# Patient Record
Sex: Female | Born: 1940 | Race: White | Hispanic: No | Smoking: Never smoker
Health system: Southern US, Community
[De-identification: ages and names within clinical notes are randomized; demographics above are authoritative.]

## PROBLEM LIST (undated history)

## (undated) DIAGNOSIS — N289 Disorder of kidney and ureter, unspecified: Secondary | ICD-10-CM

## (undated) DIAGNOSIS — R011 Cardiac murmur, unspecified: Secondary | ICD-10-CM

## (undated) DIAGNOSIS — E039 Hypothyroidism, unspecified: Secondary | ICD-10-CM

## (undated) DIAGNOSIS — E079 Disorder of thyroid, unspecified: Secondary | ICD-10-CM

## (undated) DIAGNOSIS — I10 Essential (primary) hypertension: Secondary | ICD-10-CM

## (undated) DIAGNOSIS — M1990 Unspecified osteoarthritis, unspecified site: Secondary | ICD-10-CM

## (undated) DIAGNOSIS — E7800 Pure hypercholesterolemia, unspecified: Secondary | ICD-10-CM

## (undated) HISTORY — PX: APPENDECTOMY: SHX54

## (undated) HISTORY — PX: OTHER SURGICAL HISTORY: SHX169

## (undated) HISTORY — DX: Essential (primary) hypertension: I10

## (undated) HISTORY — PX: BREAST SURGERY: SHX581

## (undated) HISTORY — DX: Pure hypercholesterolemia, unspecified: E78.00

## (undated) HISTORY — DX: Disorder of thyroid, unspecified: E07.9

## (undated) HISTORY — PX: ABDOMINAL HYSTERECTOMY: SHX81

## (undated) HISTORY — PX: GALLBLADDER SURGERY: SHX652

---

## 2004-07-04 ENCOUNTER — Ambulatory Visit: Payer: Self-pay | Admitting: Internal Medicine

## 2004-07-07 ENCOUNTER — Ambulatory Visit: Payer: Self-pay | Admitting: Internal Medicine

## 2004-07-27 ENCOUNTER — Ambulatory Visit: Payer: Self-pay | Admitting: Surgery

## 2004-07-27 HISTORY — PX: BREAST BIOPSY: SHX20

## 2005-02-20 ENCOUNTER — Ambulatory Visit: Payer: Self-pay | Admitting: Surgery

## 2005-03-09 ENCOUNTER — Ambulatory Visit: Payer: Self-pay | Admitting: Surgery

## 2005-05-22 ENCOUNTER — Encounter: Payer: Self-pay | Admitting: Physical Medicine and Rehabilitation

## 2005-06-08 ENCOUNTER — Encounter: Payer: Self-pay | Admitting: Physical Medicine and Rehabilitation

## 2005-10-18 ENCOUNTER — Ambulatory Visit: Payer: Self-pay | Admitting: Surgery

## 2006-06-08 ENCOUNTER — Ambulatory Visit: Payer: Self-pay | Admitting: Gastroenterology

## 2006-10-24 IMAGING — US ULTRASOUND LEFT BREAST
1 series · 8 of 8 positions shown · non-contrast
Comparison: none

REASON FOR EXAM: Density  US PRN
COMMENTS:

[Series 1: ultrasound left breast · 8 of 8 slices shown]
[im 1/8]
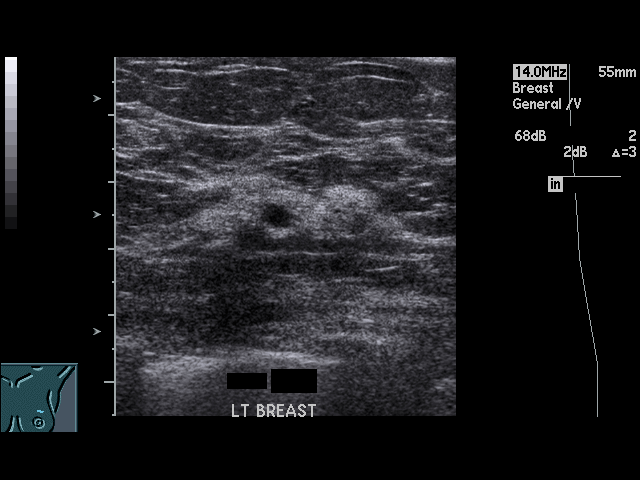
[im 2/8]
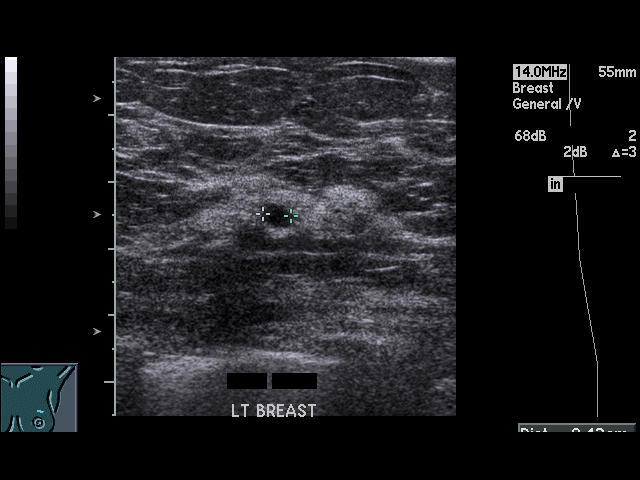
[im 3/8]
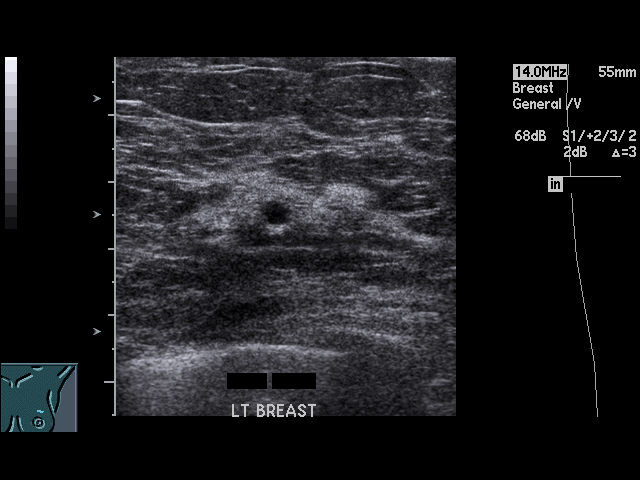
[im 4/8]
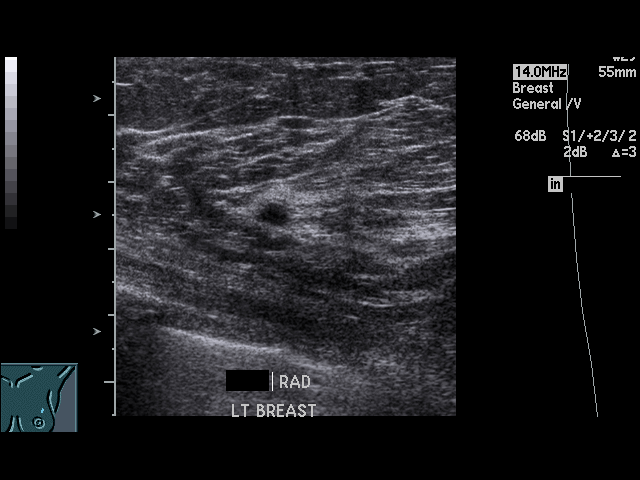
[im 5/8]
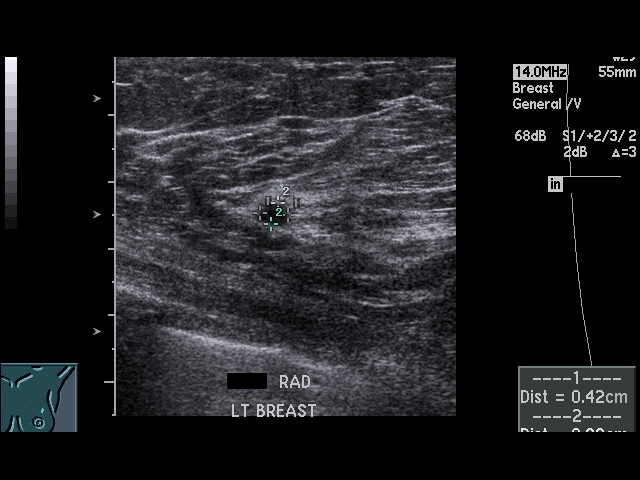
[im 6/8]
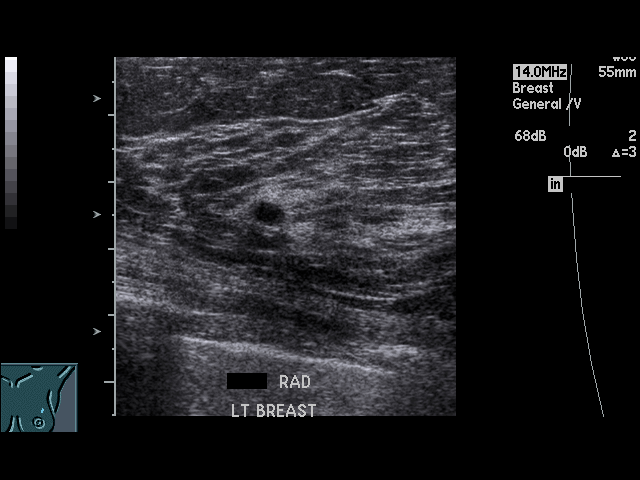
[im 7/8]
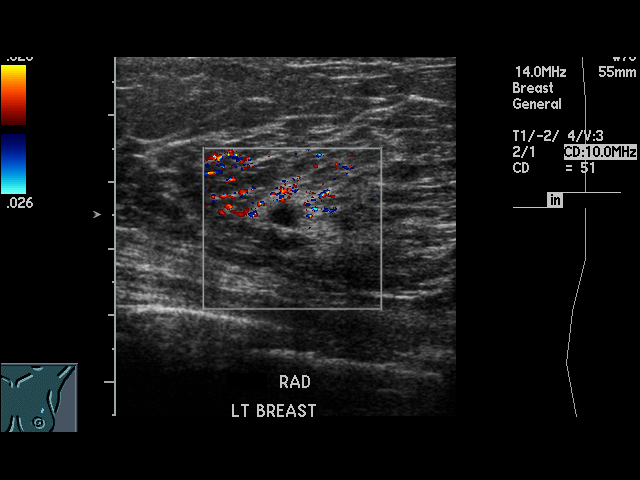
[im 8/8]
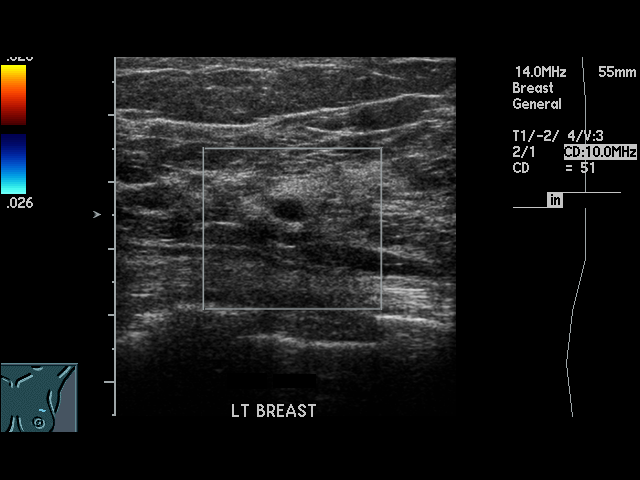

[8 of 8 positions shown; findings below may reference images not displayed]

PROCEDURE:     US  - US BREAST LEFT  - March 09, 2005 [DATE]

RESULT:     The patient returned for additional mediolateral and
magnification compression views of the deep upper outer portion of the LEFT
breast near the chest wall.  The area of ill-defined increased density
appears to persist although on the LEFT MLO projection it does not appear to
be as well circumscribed.  Given that this is the area of previous biopsy
with question as to whether this nodule is present in the specimen, surgical
consultation with an eye toward possible rebiopsy of this area is again
suggested.  The ultrasound performed today is compared to the prior study
from 07/07/04.  There is an area of hypoechogenicity in the [DATE] position of
the LEFT breast which measures 4.2 x 4.2 x 3.3 cm.  This appears to be
similar to the smaller nodularity seen on the prior study and the larger
area seen on the prior ultrasound is not definitely identified in the [DATE]
position.  There does not appear to be significant shadowing of this area of
hypoechogenicity or definite internal echo.
IMPRESSION: Persistent nodular density in the upper outer quadrant of the LEFT breast.
Sonographically this does not show definite shadowing on today's exam and
only a single area of hypoechogenicity is seen where as on the prior study
there were two areas.  Continued surgical followup is recommended.
Consideration for biopsy can be given.  The possibility of six-month
follow-up of the LEFT breast could also be considered based on the surgical
assessment and the patient's comfort level.

BI-RADS:  Category 4 - Suspicious Abnormality.

## 2006-11-22 ENCOUNTER — Ambulatory Visit: Payer: Self-pay | Admitting: Internal Medicine

## 2007-02-07 ENCOUNTER — Ambulatory Visit: Payer: Self-pay | Admitting: Internal Medicine

## 2007-04-23 ENCOUNTER — Ambulatory Visit: Payer: Self-pay | Admitting: Gastroenterology

## 2007-05-07 ENCOUNTER — Encounter: Payer: Self-pay | Admitting: Physical Medicine and Rehabilitation

## 2007-05-09 ENCOUNTER — Encounter: Payer: Self-pay | Admitting: Physical Medicine and Rehabilitation

## 2007-06-09 ENCOUNTER — Encounter: Payer: Self-pay | Admitting: Physical Medicine and Rehabilitation

## 2007-12-05 ENCOUNTER — Ambulatory Visit: Payer: Self-pay | Admitting: Internal Medicine

## 2007-12-06 ENCOUNTER — Encounter: Payer: Self-pay | Admitting: Physical Medicine and Rehabilitation

## 2007-12-07 ENCOUNTER — Encounter: Payer: Self-pay | Admitting: Physical Medicine and Rehabilitation

## 2008-05-06 ENCOUNTER — Encounter: Payer: Self-pay | Admitting: Physical Medicine and Rehabilitation

## 2008-10-29 ENCOUNTER — Ambulatory Visit: Payer: Self-pay | Admitting: Gastroenterology

## 2008-12-10 ENCOUNTER — Ambulatory Visit: Payer: Self-pay | Admitting: Internal Medicine

## 2009-02-09 ENCOUNTER — Ambulatory Visit: Payer: Self-pay | Admitting: Gastroenterology

## 2009-12-14 ENCOUNTER — Ambulatory Visit: Payer: Self-pay | Admitting: Internal Medicine

## 2010-03-09 ENCOUNTER — Ambulatory Visit: Payer: Self-pay | Admitting: Physical Medicine and Rehabilitation

## 2010-09-26 IMAGING — CT CT ASPIRATION
1 of 3 series · 15 of 32 positions shown, 20 images · non-contrast
Comparison: none

REASON FOR EXAM: elevated lft's
COMMENTS:

PROCEDURE:     CT  - CT GUIDED BIOPSY or ASPIRATION  - February 09, 2009 [DATE]
RESULT:
HISTORY: Elevated LFTs.

[Series 2: soft tissue · axial · 0.73mm/px · z∈[-617,-419]mm · 15 of 74 slices shown, 20 images]
[im 4/74  soft-tissue]
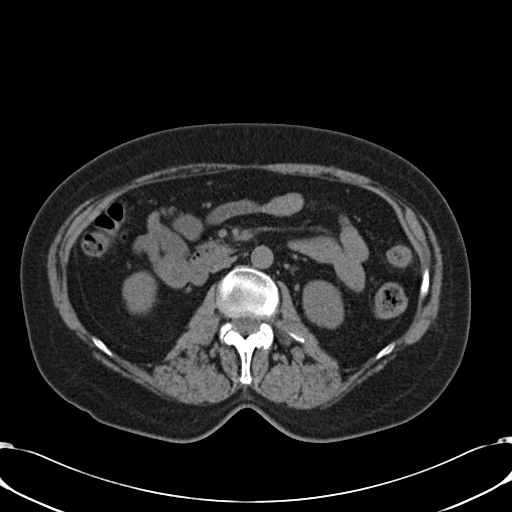
[im 4/74  bone]
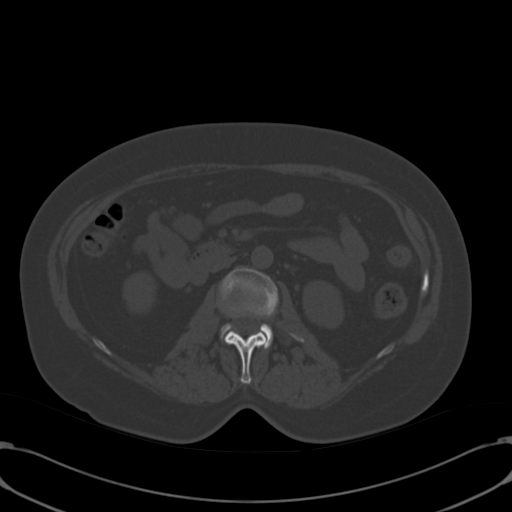
[im 11/74  soft-tissue]
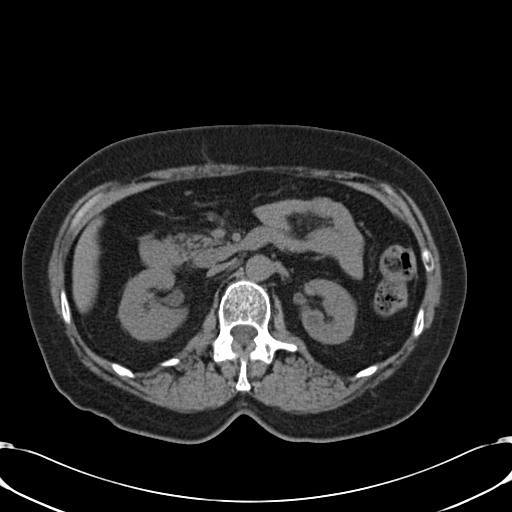
[im 15/74  soft-tissue]
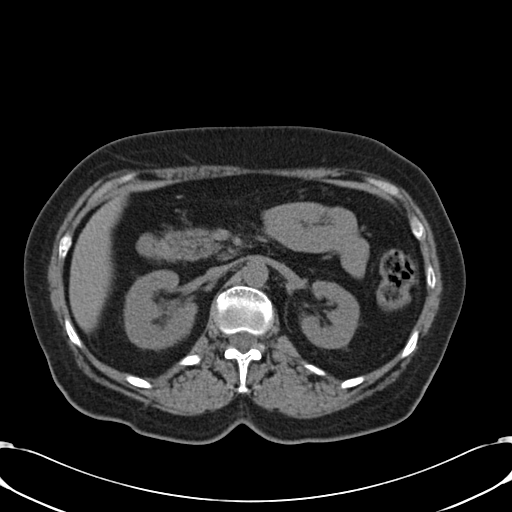
[im 19/74  soft-tissue]
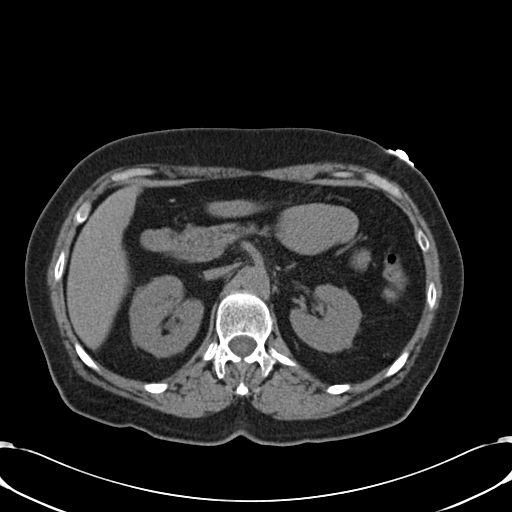
[im 26/74  soft-tissue]
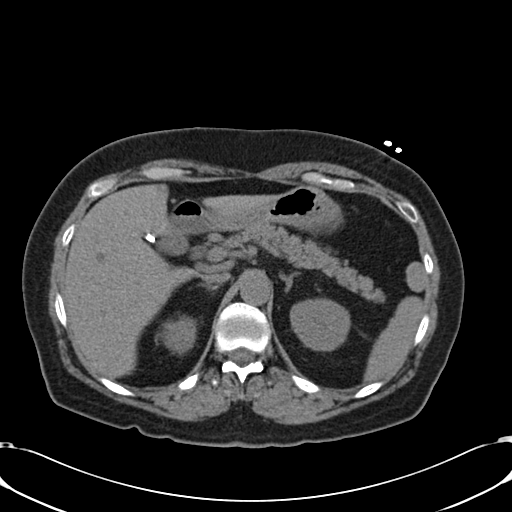
[im 30/74  soft-tissue]
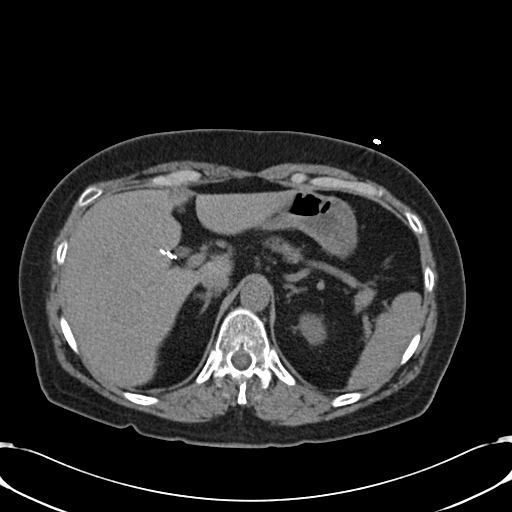
[im 33/74  soft-tissue]
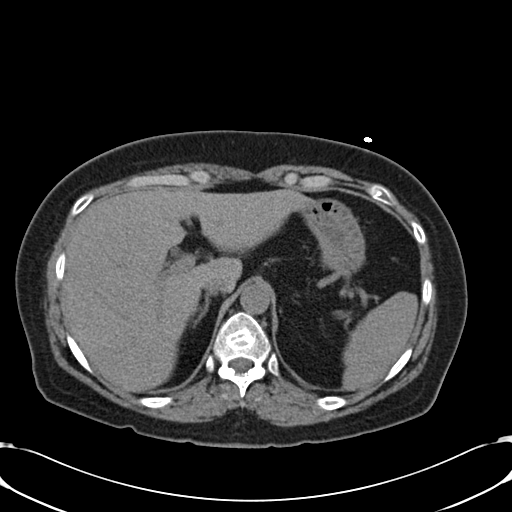
[im 41/74  soft-tissue]
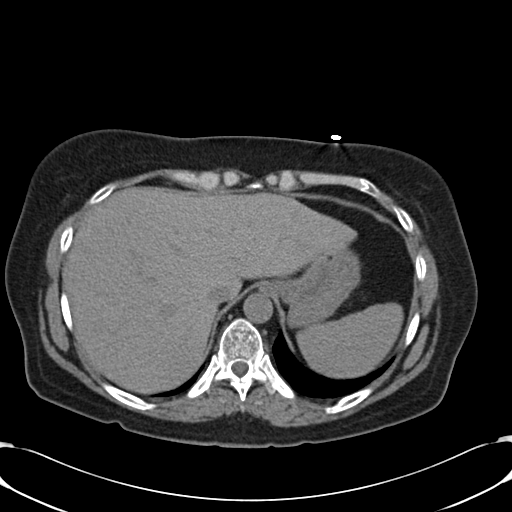
[im 44/74  soft-tissue]
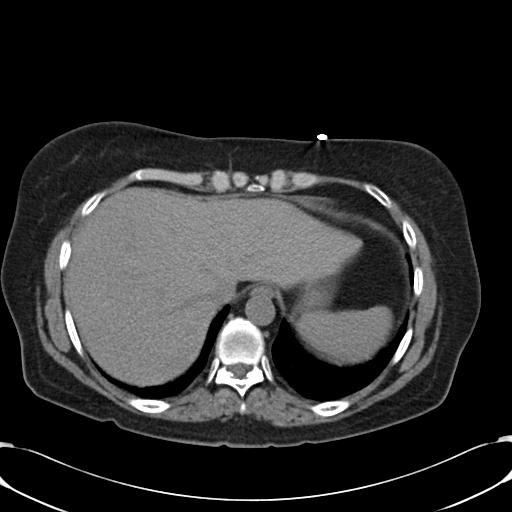
[im 44/74  bone]
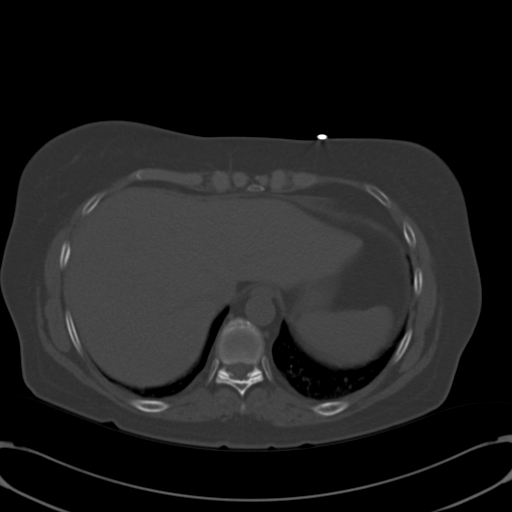
[im 48/74  soft-tissue]
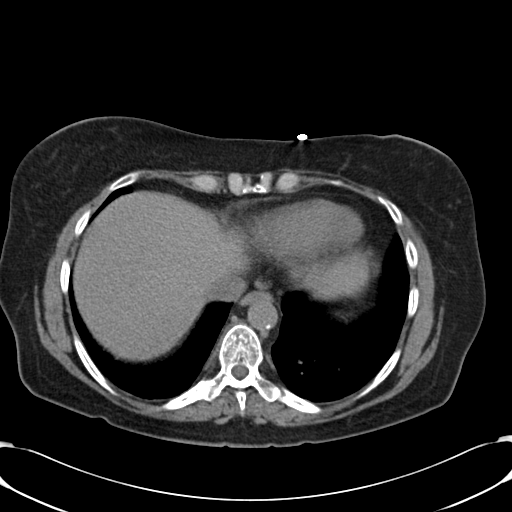
[im 55/74  soft-tissue]
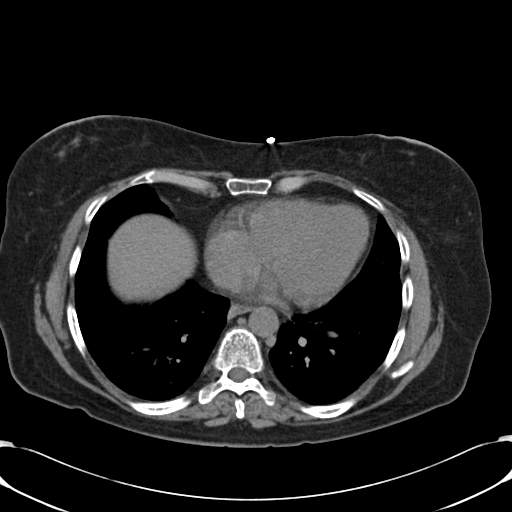
[im 59/74  soft-tissue]
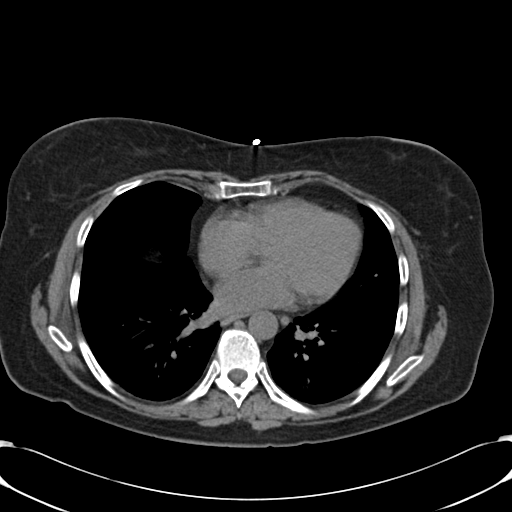
[im 59/74  lung]
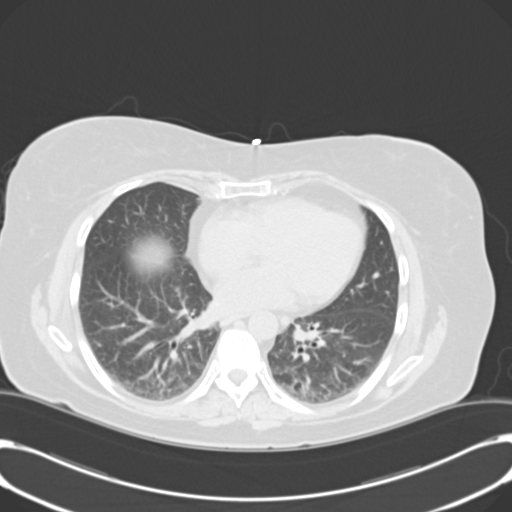
[im 63/74  soft-tissue]
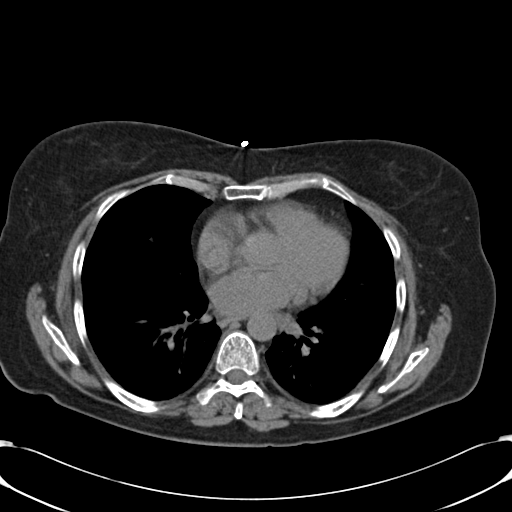
[im 63/74  lung]
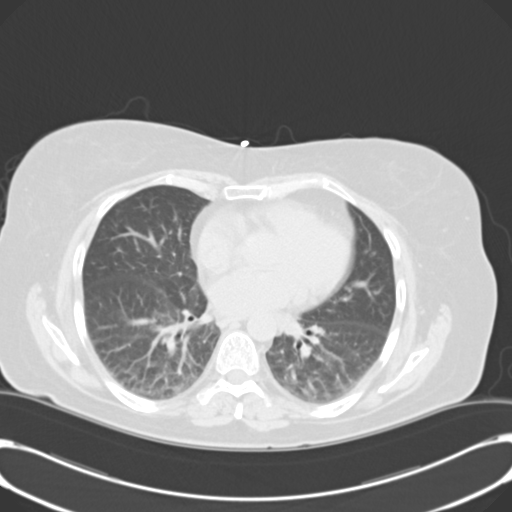
[im 66/74  lung]
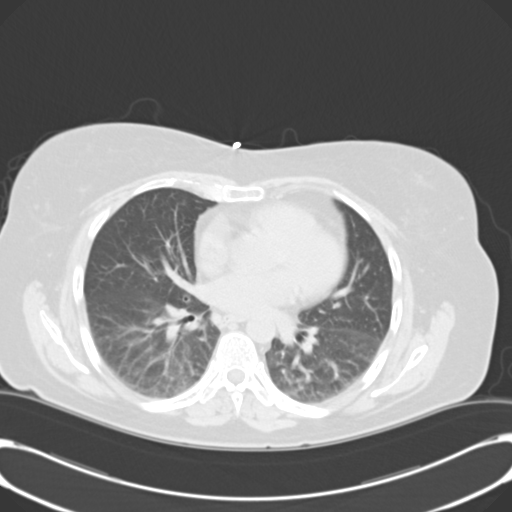
[im 70/74  soft-tissue]
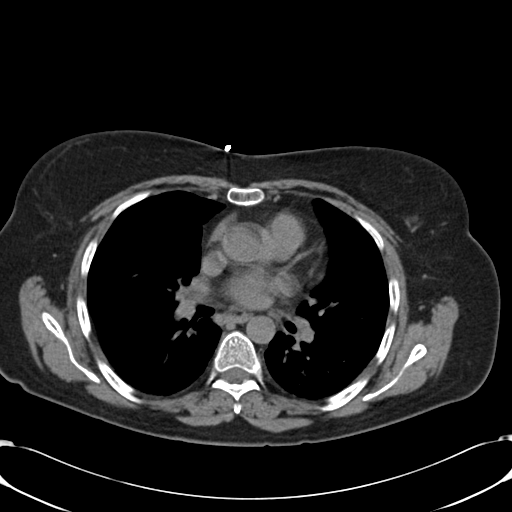
[im 70/74  lung]
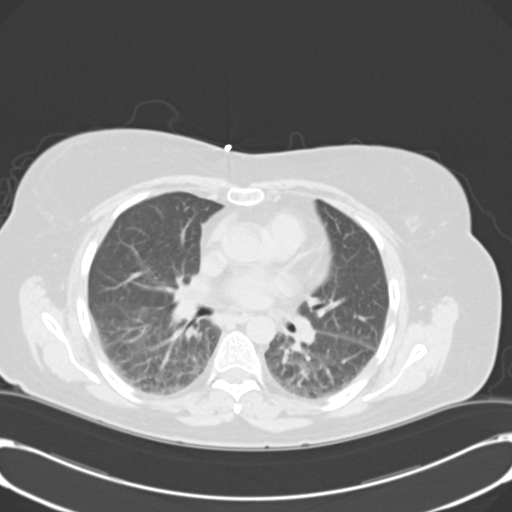

[15 of 32 positions shown; findings below may reference images not displayed]

PROCEDURE AND FINDINGS: After discussing the risks and benefits of this
procedure with the patient, informed consent was obtained. The right abdomen
was sterilely prepped and draped and following local anesthesia with 1%
lidocaine an 18-gauge needle was advanced into the liver and 18-gauge core
sample was obtained for histologic evaluation.
IMPRESSION: 1. Successful hepatic biopsy, CT directed. The exam was performed under CT
guidance as no good location for the biopsy was noted with ultrasound.

## 2010-12-28 ENCOUNTER — Ambulatory Visit: Payer: Self-pay | Admitting: Internal Medicine

## 2011-03-29 ENCOUNTER — Encounter: Payer: Self-pay | Admitting: Physical Medicine and Rehabilitation

## 2011-04-08 ENCOUNTER — Encounter: Payer: Self-pay | Admitting: Physical Medicine and Rehabilitation

## 2011-05-09 ENCOUNTER — Encounter: Payer: Self-pay | Admitting: Physical Medicine and Rehabilitation

## 2011-10-24 IMAGING — US US RENAL KIDNEY
1 series · 17 of 24 positions shown · non-contrast
Comparison: none

REASON FOR EXAM: kidney cyst
COMMENTS:

[Series 1: us renal kidney · 17 of 24 slices shown]
[im 1/24]
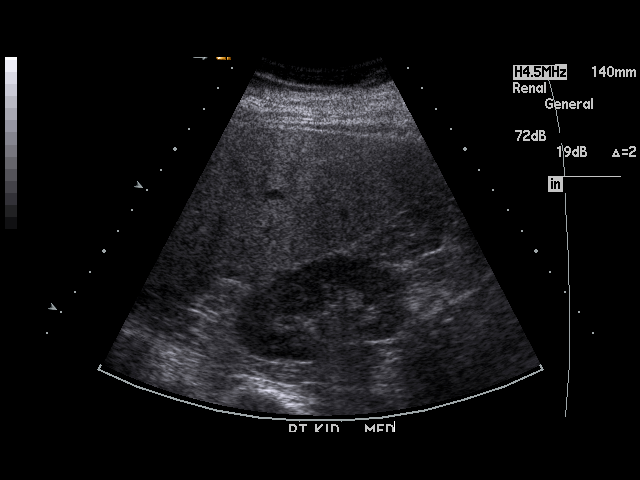
[im 3/24]
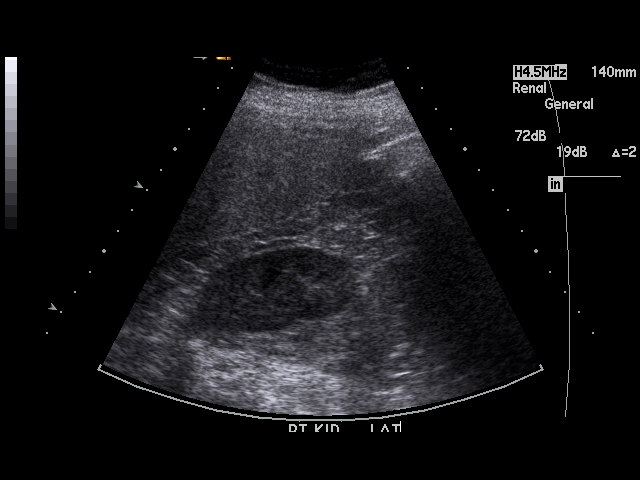
[im 4/24]
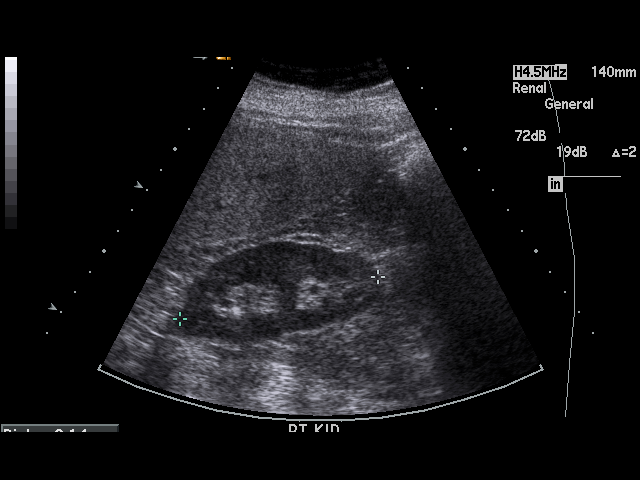
[im 5/24]
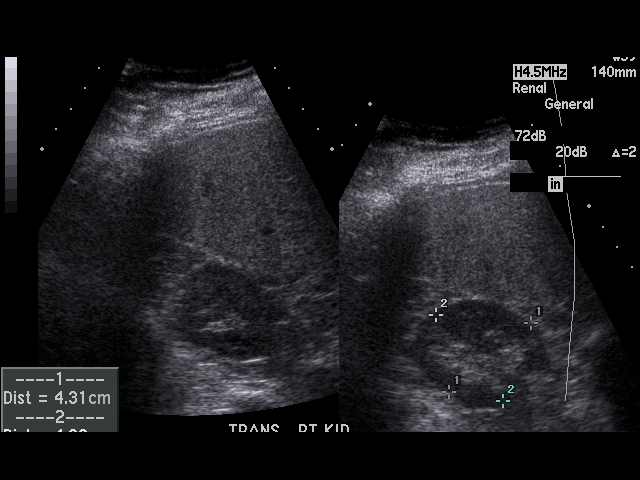
[im 7/24]
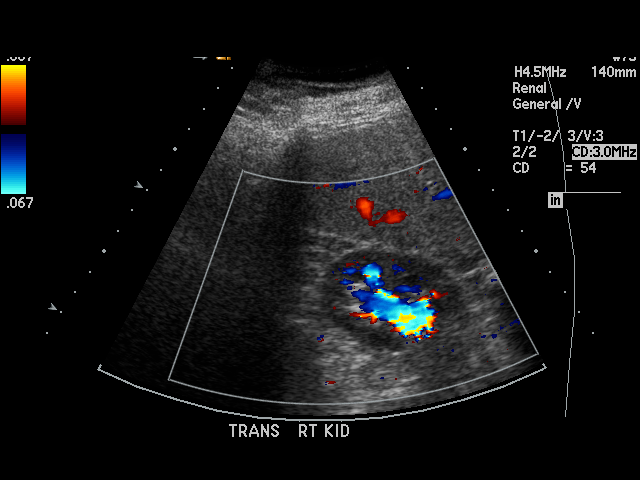
[im 8/24]
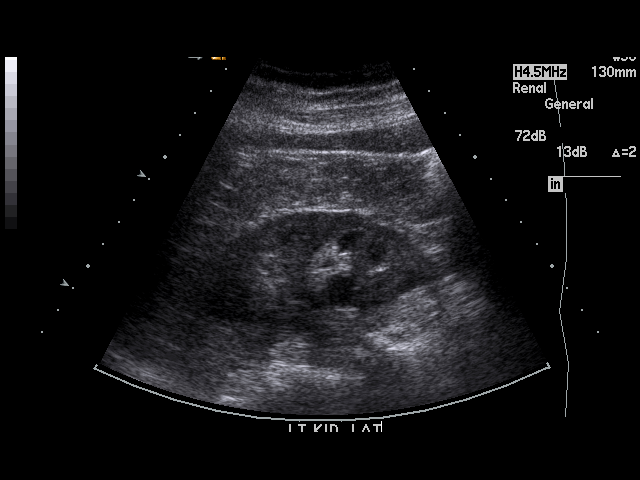
[im 10/24]
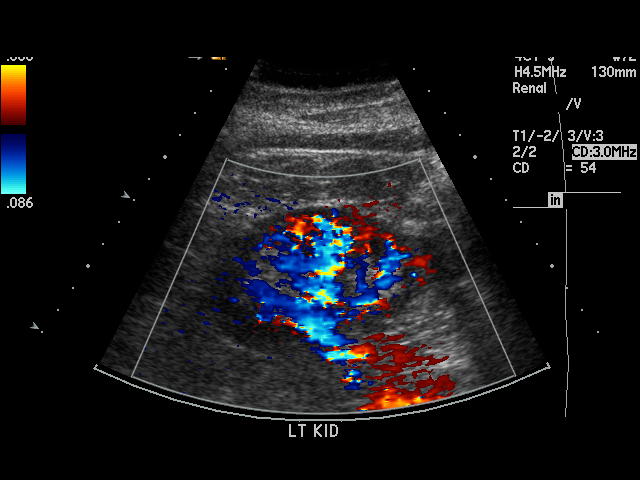
[im 11/24]
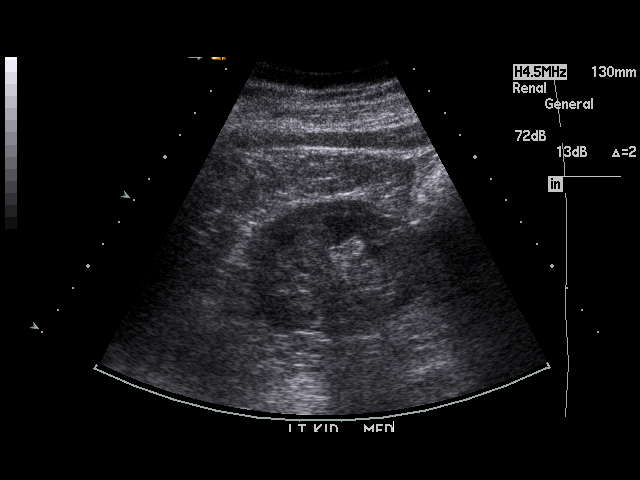
[im 13/24]
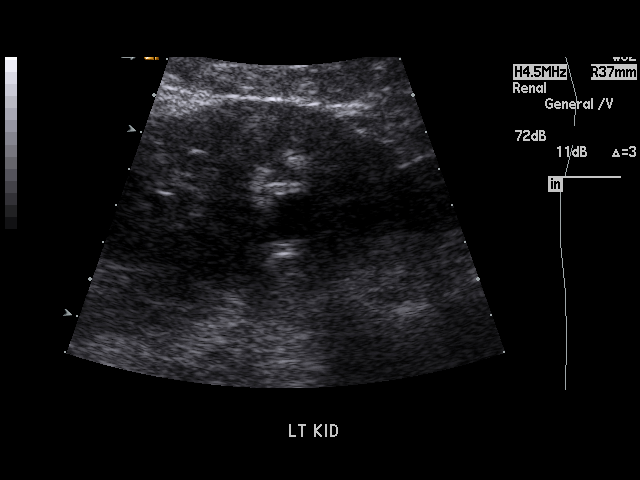
[im 14/24]
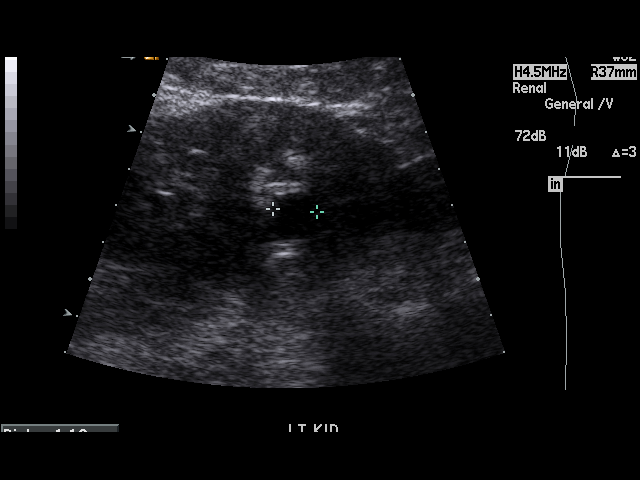
[im 15/24]
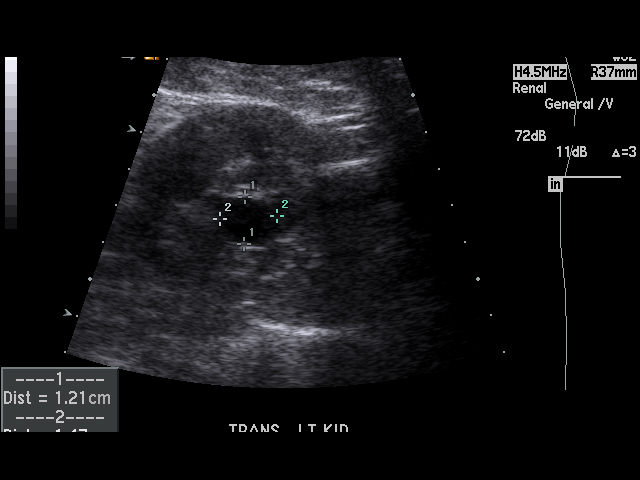
[im 17/24]
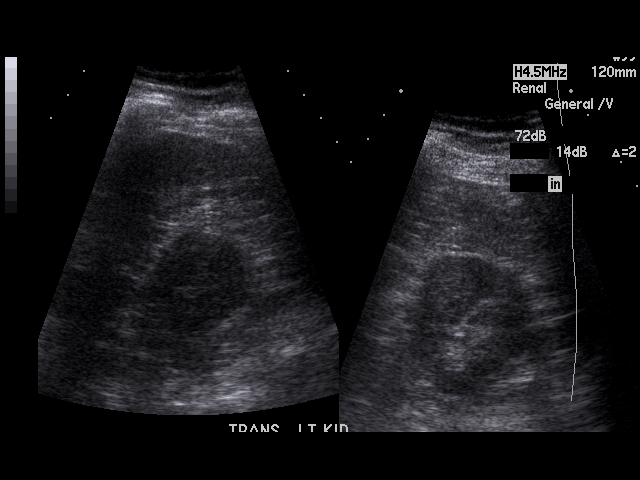
[im 18/24]
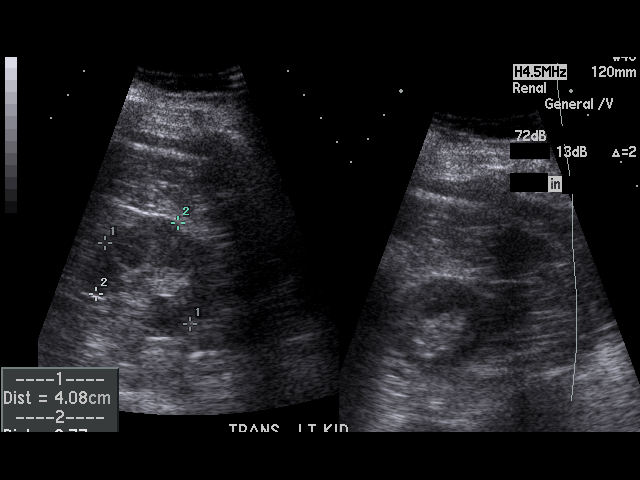
[im 20/24]
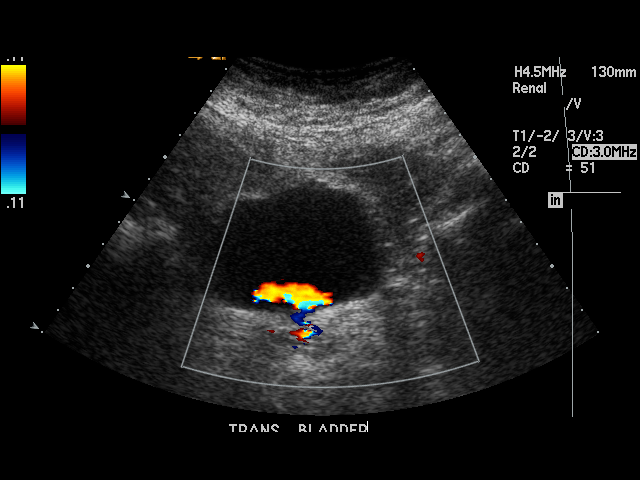
[im 21/24]
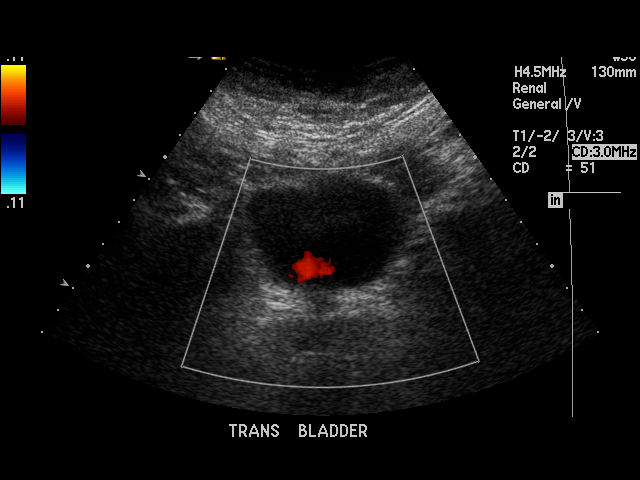
[im 22/24]
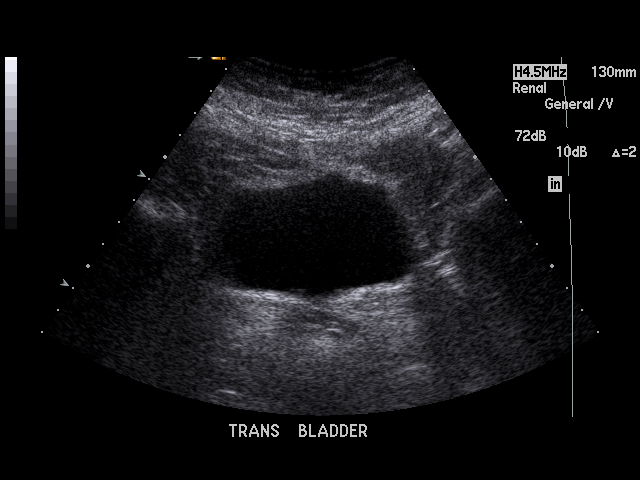
[im 24/24]
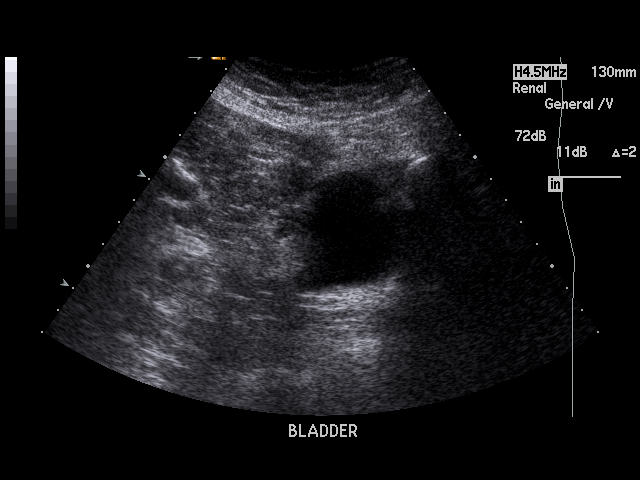

[17 of 24 positions shown; findings below may reference images not displayed]

PROCEDURE:     [facility] - AM[facility] KIDNEY  - March 09, 2010 [DATE]

RESULT:     Renal sonogram shows the right kidney measures 8.3 x 4.3 x
cm. There is no hydronephrosis or mass. No stones are evident. The left
kidney measures 9.0 x 4.1 x 3.7 cm and demonstrates no evidence of a stone
or obstruction. There is a 1.13 x 1.21 x 1.47 cm hypodense area in the mid
left kidney parapelvic region suggestive of a possible cyst. There is urine
in the bladder with a normal color Doppler evidence of a ureteral jet
demonstrated.
IMPRESSION: Single left renal cyst.

## 2012-01-02 ENCOUNTER — Ambulatory Visit: Payer: Self-pay | Admitting: Internal Medicine

## 2013-01-14 ENCOUNTER — Ambulatory Visit: Payer: Self-pay | Admitting: Internal Medicine

## 2013-12-28 DIAGNOSIS — M810 Age-related osteoporosis without current pathological fracture: Secondary | ICD-10-CM | POA: Insufficient documentation

## 2013-12-28 DIAGNOSIS — M545 Low back pain, unspecified: Secondary | ICD-10-CM | POA: Insufficient documentation

## 2013-12-28 DIAGNOSIS — N183 Chronic kidney disease, stage 3 unspecified: Secondary | ICD-10-CM | POA: Insufficient documentation

## 2013-12-28 DIAGNOSIS — I10 Essential (primary) hypertension: Secondary | ICD-10-CM | POA: Insufficient documentation

## 2013-12-28 DIAGNOSIS — G8929 Other chronic pain: Secondary | ICD-10-CM | POA: Insufficient documentation

## 2013-12-28 DIAGNOSIS — J302 Other seasonal allergic rhinitis: Secondary | ICD-10-CM | POA: Insufficient documentation

## 2014-02-10 ENCOUNTER — Ambulatory Visit: Payer: Self-pay | Admitting: Internal Medicine

## 2014-03-04 ENCOUNTER — Ambulatory Visit (INDEPENDENT_AMBULATORY_CARE_PROVIDER_SITE_OTHER): Payer: Medicare PPO

## 2014-03-04 ENCOUNTER — Encounter: Payer: Self-pay | Admitting: *Deleted

## 2014-03-04 ENCOUNTER — Other Ambulatory Visit: Payer: Self-pay | Admitting: *Deleted

## 2014-03-04 ENCOUNTER — Ambulatory Visit (INDEPENDENT_AMBULATORY_CARE_PROVIDER_SITE_OTHER): Payer: Medicare PPO | Admitting: Podiatry

## 2014-03-04 ENCOUNTER — Encounter: Payer: Self-pay | Admitting: Podiatry

## 2014-03-04 VITALS — BP 139/69 | HR 78 | Resp 16 | Ht 65.0 in | Wt 165.0 lb

## 2014-03-04 DIAGNOSIS — G4700 Insomnia, unspecified: Secondary | ICD-10-CM | POA: Insufficient documentation

## 2014-03-04 DIAGNOSIS — M7752 Other enthesopathy of left foot: Secondary | ICD-10-CM

## 2014-03-04 DIAGNOSIS — Z780 Asymptomatic menopausal state: Secondary | ICD-10-CM | POA: Insufficient documentation

## 2014-03-04 DIAGNOSIS — E538 Deficiency of other specified B group vitamins: Secondary | ICD-10-CM | POA: Insufficient documentation

## 2014-03-04 DIAGNOSIS — R609 Edema, unspecified: Secondary | ICD-10-CM

## 2014-03-04 DIAGNOSIS — M779 Enthesopathy, unspecified: Secondary | ICD-10-CM

## 2014-03-04 DIAGNOSIS — M778 Other enthesopathies, not elsewhere classified: Secondary | ICD-10-CM

## 2014-03-04 DIAGNOSIS — E559 Vitamin D deficiency, unspecified: Secondary | ICD-10-CM | POA: Insufficient documentation

## 2014-03-04 DIAGNOSIS — K219 Gastro-esophageal reflux disease without esophagitis: Secondary | ICD-10-CM | POA: Insufficient documentation

## 2014-03-04 DIAGNOSIS — E039 Hypothyroidism, unspecified: Secondary | ICD-10-CM | POA: Insufficient documentation

## 2014-03-04 DIAGNOSIS — M2042 Other hammer toe(s) (acquired), left foot: Secondary | ICD-10-CM

## 2014-03-04 DIAGNOSIS — E785 Hyperlipidemia, unspecified: Secondary | ICD-10-CM | POA: Insufficient documentation

## 2014-03-04 NOTE — Progress Notes (Signed)
   Subjective:    Patient ID: Peggy Phillips, female    DOB: Jul 31, 1940, 73 y.o.   MRN: [id number]  HPI Comments: Second toe left foot seems to be swollen and hurts to touch. It started last winter and seems to get worse   Toe Pain       Review of Systems  Musculoskeletal: Positive for back pain.       Objective:   Physical Exam: I have reviewed her past medical history medications allergies surgery social history and review of systems. Pulses are strongly palpable bilateral. Neurologic sensorium is intact per Semmes-Weinstein monofilament. Deep tendon reflexes intact bilateral muscle strength is 5 over 5 dorsiflexion plantar flexors and inverters everters all of his musculature is intact. Orthopedic evaluation demonstrates hammertoe deformity second left with erythema and edema to the PIPJ and DIPJ of the second digit left foot. Radiographic evaluation of the second digit left foot. 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        Assessment & Plan:  Assessment: Capsulitis hammertoe deformity second digit left foot.  Plan: Discussed etiology pathology conservative versus surgical therapies. I injected the PIPJ of the DIPJ with dexamethasone and local anesthetic. We discussed surgical correction consisting of any DIPJ and PIPJ fusion with screw second digit left foot. Should this injection therapy not alleviate her symptoms and she is considering surgical intervention.

## 2015-01-18 ENCOUNTER — Ambulatory Visit: Payer: Medicare PPO | Admitting: Podiatry

## 2016-01-17 ENCOUNTER — Other Ambulatory Visit: Payer: Self-pay | Admitting: Internal Medicine

## 2016-01-17 DIAGNOSIS — Z1231 Encounter for screening mammogram for malignant neoplasm of breast: Secondary | ICD-10-CM

## 2016-08-08 ENCOUNTER — Ambulatory Visit
Admission: RE | Admit: 2016-08-08 | Discharge: 2016-08-08 | Disposition: A | Discharge status: Home / Self Care | Payer: Medicare Other | Source: Ambulatory Visit | Attending: Internal Medicine | Admitting: Internal Medicine

## 2016-08-08 ENCOUNTER — Encounter: Payer: Self-pay | Admitting: Radiology

## 2016-08-08 DIAGNOSIS — Z1231 Encounter for screening mammogram for malignant neoplasm of breast: Secondary | ICD-10-CM | POA: Insufficient documentation

## 2017-10-08 ENCOUNTER — Other Ambulatory Visit: Payer: Self-pay | Admitting: Nephrology

## 2017-10-08 DIAGNOSIS — N183 Chronic kidney disease, stage 3 unspecified: Secondary | ICD-10-CM

## 2017-10-10 ENCOUNTER — Ambulatory Visit: Payer: Medicare Other

## 2017-10-11 ENCOUNTER — Ambulatory Visit
Admission: RE | Admit: 2017-10-11 | Discharge: 2017-10-11 | Disposition: A | Discharge status: Home / Self Care | Payer: Medicare Other | Source: Ambulatory Visit | Attending: Nephrology | Admitting: Nephrology

## 2017-10-11 DIAGNOSIS — N183 Chronic kidney disease, stage 3 unspecified: Secondary | ICD-10-CM

## 2019-03-13 ENCOUNTER — Encounter: Payer: Self-pay | Admitting: Nephrology

## 2019-03-13 NOTE — Progress Notes (Signed)
[facility]  [address] [place], [place] [address] Phone: [phone]  Fax: [phone]   Clinic Day:  03/14/2019  Referring physician: Anthonette Legato, MD  Chief Complaint: Peggy Phillips is a 78 y.o. female with anemia in chronic kidney disease who is referred in consultation by Dr Anthonette Legato for assessment and management.   HPI:  The patient has stage III chronic kidney disease.  She was last seen in the nephrology clinic on 02/13/2019 to reestablish care.  She had last been seen in 10/2017.  She presented with worsening renal function.  Creatinine clearance was 28 ml/minute (previously 33 ml/min).  Creatinine fluctuation was felt secondary to diuretic usage and poor fluid intake.  Blood pressure was well controlled.  Creatinine has been followed: 1.48 on 09/04/2017, 1.52 on 10/15/2017, 1.9 on 07/17/2017, 1.9 on 07/08/2018, 1.9 on 01/09/2019, and 1.67 on 02/13/2019.  SPEP and random UPEP on 09/06/2017 revealed no monoclonal protein.  Renal ultrasound on 10/11/2017 revealed no acute findings.   She has been unaware of a diagnosis of anemia or iron deficiency.  She has a history of B12 deficiency and hypothyroidism.  She has been on oral B12 for many years.  B12 was 762 on 07/17/2017.  TSH was 2.952 (normal) on 01/09/2019.   Ferritin has been followed:  26 on 07/17/2016, 29 on 03/16/2015, 19 on 10/15/2017 and 23 on 02/13/2019.  Iron saturation was 10% with a TIBC of 370 on 02/13/2019.  CBC followed 07/17/2016:  Hematocrit 33.9, hemoglobin 11.5, MCV 90.4, WBC 5900, ANC 3,820.  01/29/2017:  Hematocrit 37.3, hemoglobin 12.5, MCV 92.1, platelets 328,000, WBC 6500.  07/17/2017:  Hematocrit 33.0, hemoglobin 10.9, MCV 87.1, platelets 278,000, WBC 6000.  09/04/2017:  Hematocrit 30.4, hemoglobin 11.0, MCV 88.0, platelets 260,000, WBC 5800. 02/13/2019:  Hematocrit 29.6, hemoglobin 9.9, MCV 85.8, platelets 225,000, WBC 4700.  Her diet doesn't consist of  not much meat or vegetables. Her granddaughter lives with her and is vegetarian.  She eats cabbage, collards and broccoli. She has craving for sugar. She denies ice pica.  She denies restless legs.   Last colonoscopy was 7 years ago by Dr. Gustavo Lah (no report available).  Family history is notable for her mother who had lung cancer; she smoked.   Past Medical History:  Diagnosis Date  . High cholesterol   . Hypertension   . Thyroid disease     Past Surgical History:  Procedure Laterality Date  . ABDOMINAL HYSTERECTOMY    . APPENDECTOMY    . BREAST BIOPSY Left 07/27/2004   neg  . BREAST SURGERY    . GALLBLADDER SURGERY    . herniated disc      Family History  Problem Relation Age of Onset  . Breast cancer Neg Hx     Social History:  reports that she has never smoked. She has never used smokeless tobacco. She reports that she does not use drugs. No history on file for alcohol. Granddaughter live with her. She was a retired Clinical biochemist. She lives in [place].  The patient is alone  today.  Allergies:  Allergies  Allergen Reactions  . Atorvastatin Other (See Comments)    Elevated LFT's  . Codeine Sulfate Other (See Comments)  . Meloxicam Diarrhea  . Other Hives    sporonox   . Simvastatin     Other reaction(s): Liver Disorder Elevated LFT's  . Sulfa Antibiotics Other (See Comments)    Current Medications: Current Outpatient Medications  Medication Sig  Dispense Refill  . allopurinol (ZYLOPRIM) 300 MG tablet     . ammonium lactate (LAC-HYDRIN) 12 % lotion Comments:   Filled Date: Aug 25 2017 12:00AM  Duration: 20    . chlorthalidone (HYGROTON) 25 MG tablet Take 25 mg by mouth daily.    . Cyanocobalamin (VITAMIN B 12) 500 MCG TABS Take 500 mcg by mouth.    Mariane Baumgarten Sodium 100 MG capsule Take by mouth.    Marland Kitchen FLUoxetine (PROZAC) 20 MG capsule Take 20 mg by mouth daily.    . furosemide (LASIX) 20 MG tablet     . HYDROmorphone (DILAUDID) 4 MG  tablet     . levothyroxine (SYNTHROID, LEVOTHROID) 112 MCG tablet Take 112 mcg by mouth daily before breakfast.    . lisinopril (ZESTRIL) 40 MG tablet Comments:   Filled Date: Jun 27 2017 12:00AM  Duration: 90    . metoprolol succinate (TOPROL-XL) 25 MG 24 hr tablet Comments:   Filled Date: Aug 25 2017 12:00AM  Duration: 90    . omeprazole (PRILOSEC) 20 MG capsule     . polycarbophil (FIBERCON) 625 MG tablet Take by mouth.    . spironolactone (ALDACTONE) 25 MG tablet Take by mouth.    . traZODone (DESYREL) 50 MG tablet Take by mouth.    . Vitamin D, Ergocalciferol, (DRISDOL) 50000 UNITS CAPS capsule     . Wheat Dextrin (BENEFIBER PO) Take by mouth.    Marland Kitchen ZETIA 10 MG tablet     . diclofenac sodium (VOLTAREN) 1 % GEL Apply topically.    . fluticasone (FLONASE) 50 MCG/ACT nasal spray     . naloxone (NARCAN) 4 MG/0.1ML LIQD nasal spray kit      No current facility-administered medications for this visit.     Review of Systems  Constitutional: Positive for weight loss (200 to 182 pounds over several months as not eating much). Negative for chills and malaise/fatigue.  HENT: Negative.  Negative for congestion, ear pain, hearing loss, nosebleeds, sinus pain and sore throat.   Eyes: Negative for blurred vision, double vision, photophobia and pain.       Slight decrease in vision (saw ophthalmologist recently).  Respiratory: Negative.  Negative for cough, hemoptysis, sputum production, shortness of breath and wheezing.   Cardiovascular: Negative.  Negative for chest pain, palpitations and leg swelling.  Gastrointestinal: Negative for abdominal pain, blood in stool, constipation, diarrhea, nausea and vomiting.       No eating much.  Stomach sensitive.  H/o diverticulitis.  Colonoscopy years ago.  Genitourinary: Negative.  Negative for dysuria, frequency, hematuria and urgency.  Musculoskeletal: Positive for joint pain (diffuse osteoarthritis) and neck pain (tendonitis). Negative for back pain,  falls and myalgias.  Skin: Negative for rash.       Chronic dry skin.  Neurological: Negative.  Negative for dizziness, sensory change, speech change, focal weakness, weakness and headaches.  Endo/Heme/Allergies: Negative.   Psychiatric/Behavioral: Negative.  Negative for depression and memory loss. The patient is not nervous/anxious and does not have insomnia.    Performance status (ECOG): 1  Vitals Blood pressure (!) 130/41, pulse (!) 55, temperature 98.8 F (37.1 C), temperature source Tympanic, resp. rate 18, height _0  (1.626 m), weight 182 lb 6.9 oz (82.7 kg), SpO2 100 %.  Physical Exam  Constitutional: She is oriented to person, place, and time. She appears well-developed. No distress.  HENT:  Head: Normocephalic and atraumatic.  Mouth/Throat: Oropharynx is clear and moist. No oropharyngeal exudate.  Short styled blonde hair.  Mask.  Eyes: Pupils are equal, round, and reactive to light. Conjunctivae and EOM are normal. No scleral icterus.  Blue eyes.  Neck: Normal range of motion. Neck supple. No JVD present.  Cardiovascular: Normal rate, regular rhythm and normal heart sounds. Exam reveals no gallop.  No murmur heard. Pulmonary/Chest: Effort normal and breath sounds normal. No respiratory distress. She has no wheezes. She has no rales. She exhibits no tenderness.  Abdominal: Soft. Bowel sounds are normal. She exhibits no distension and no mass. There is no abdominal tenderness. There is no rebound and no guarding.  Musculoskeletal: Normal range of motion.        General: Edema (bilateral trace tender lower extremity) present.  Lymphadenopathy:       Head (right side): No preauricular, no posterior auricular and no occipital adenopathy present.       Head (left side): No preauricular and no posterior auricular adenopathy present.    She has no cervical adenopathy.    She has no axillary adenopathy.       Right: No inguinal and no supraclavicular adenopathy present.        Left: No inguinal and no supraclavicular adenopathy present.  Neurological: She is alert and oriented to person, place, and time.  Skin: Skin is warm and dry. No rash noted. She is not diaphoretic. No erythema. No pallor.  Psychiatric: She has a normal mood and affect. Her behavior is normal. Judgment and thought content normal.  Nursing note and vitals reviewed.   No visits with results within 3 Day(s) from this visit.  Latest known visit with results is:  No results found for any previous visit.    Assessment:  Peggy Phillips is a 78 y.o. female with anemia of chronic renal disease.  Creatinine was 1.9 on 01/09/2019 and 1.67 on 02/13/2019.  SPEP and random UPEP on 09/06/2017 revealed no monoclonal protein.  Renal ultrasound on 10/11/2017 revealed no acute findings.   Labs on 02/13/2019 revealed a hematocrit 29.6, hemoglobin 9.9, MCV 85.8, platelets 225,000, WBC 4700.  She has iron deficiency.  Diet is poor.  Last colonoscopy was 7 years ago.  She denies any melena, hematochezia, hematuria or vaginal bleeding. Ferritin has been followed:  26 on 07/17/2016, 29 on 03/16/2015, 19 on 10/15/2017 and 23 on 02/13/2019.  Iron saturation was 10% with a TIBC of 370 on 02/13/2019.  She has B12 deficiency.  She has been on oral B12 for many years.  B12 was 762 on 07/17/2017.   Symptomatically, she notes a sensitive stomach, not eating much, and a weight loss of 18 pounds over the past few months.  Exam is unremarkable.  Plan: 1.   Labs today:  CBC with diff, retic, ferritin, iron studies, sed rate, B12, folate. 2.   Anemia of chronic renal disease  Patient has Cr 1.67 on 02/13/2019.  Discuss r/o other causes of anemia then consideration of Retacrit.  Begin Retacrit if iron stores normal and hemoglobin < 10.  Preauth Retacrit. 3.   Iron deficiency  Ferritin was 23 (low) on 02/13/2019.  Etiology appears secondary to diet.  Concern for weight loss.  Last colonoscopy 7 years ago.  Patient denies  any bleeding.  Begin ferrous sulfate 325 mg po q day with OJ or vitamin C.  Preauth Venofer. 4.   B12 deficiency  Patient on oral B12.  Check B12 and folate level. 5.   RTC in 1 month for MD assessment, labs (CBC with diff, ferritin), review of work-up and +/-  Venofer.  I discussed the assessment and treatment plan with the patient.  The patient was provided an opportunity to ask questions and all were answered.  The patient agreed with the plan and demonstrated an understanding of the instructions.  The patient was advised to call back if the symptoms worsen or if the condition fails to improve as anticipated.  I provided 26 minutes (1:50 PM - 2:15 PM) of face-to-face time during this this encounter and > 50% was spent counseling as documented under my assessment and plan.    Melissa C. Mike Gip, MD, PhD    03/14/2019, 2:15 PM  I, Samul Dada, am acting as scribe for [employer]. Mike Gip, MD, PhD.  I, Melissa C. Mike Gip, MD, have reviewed the above documentation for accuracy and completeness, and I agree with the above.

## 2019-03-14 ENCOUNTER — Encounter: Payer: Self-pay | Admitting: Hematology and Oncology

## 2019-03-14 ENCOUNTER — Other Ambulatory Visit: Payer: Self-pay

## 2019-03-14 ENCOUNTER — Inpatient Hospital Stay: Payer: Medicare Other | Attending: Hematology and Oncology | Admitting: Hematology and Oncology

## 2019-03-14 ENCOUNTER — Inpatient Hospital Stay: Payer: Medicare Other

## 2019-03-14 VITALS — BP 130/41 | HR 55 | Temp 98.8°F | Resp 18 | Ht 64.0 in | Wt 182.4 lb

## 2019-03-14 DIAGNOSIS — M1990 Unspecified osteoarthritis, unspecified site: Secondary | ICD-10-CM | POA: Insufficient documentation

## 2019-03-14 DIAGNOSIS — E7800 Pure hypercholesterolemia, unspecified: Secondary | ICD-10-CM | POA: Insufficient documentation

## 2019-03-14 DIAGNOSIS — E039 Hypothyroidism, unspecified: Secondary | ICD-10-CM | POA: Diagnosis not present

## 2019-03-14 DIAGNOSIS — R634 Abnormal weight loss: Secondary | ICD-10-CM | POA: Insufficient documentation

## 2019-03-14 DIAGNOSIS — I129 Hypertensive chronic kidney disease with stage 1 through stage 4 chronic kidney disease, or unspecified chronic kidney disease: Secondary | ICD-10-CM | POA: Diagnosis not present

## 2019-03-14 DIAGNOSIS — D649 Anemia, unspecified: Secondary | ICD-10-CM | POA: Insufficient documentation

## 2019-03-14 DIAGNOSIS — N183 Chronic kidney disease, stage 3 unspecified: Secondary | ICD-10-CM | POA: Diagnosis not present

## 2019-03-14 DIAGNOSIS — E538 Deficiency of other specified B group vitamins: Secondary | ICD-10-CM | POA: Insufficient documentation

## 2019-03-14 DIAGNOSIS — E611 Iron deficiency: Secondary | ICD-10-CM | POA: Diagnosis not present

## 2019-03-14 DIAGNOSIS — D631 Anemia in chronic kidney disease: Secondary | ICD-10-CM | POA: Insufficient documentation

## 2019-03-14 DIAGNOSIS — Z79899 Other long term (current) drug therapy: Secondary | ICD-10-CM | POA: Diagnosis not present

## 2019-03-14 LAB — CBC WITH DIFFERENTIAL/PLATELET
Abs Immature Granulocytes: 0.03 10*3/uL (ref 0.00–0.07)
Basophils Absolute: 0.1 10*3/uL (ref 0.0–0.1)
Basophils Relative: 1 %
Eosinophils Absolute: 0.1 10*3/uL (ref 0.0–0.5)
Eosinophils Relative: 2 %
HCT: 31.8 % — ABNORMAL LOW (ref 36.0–46.0)
Hemoglobin: 10.4 g/dL — ABNORMAL LOW (ref 12.0–15.0)
Immature Granulocytes: 0 %
Lymphocytes Relative: 33 %
Lymphs Abs: 2.4 10*3/uL (ref 0.7–4.0)
MCH: 27.9 pg (ref 26.0–34.0)
MCHC: 32.7 g/dL (ref 30.0–36.0)
MCV: 85.3 fL (ref 80.0–100.0)
Monocytes Absolute: 0.8 10*3/uL (ref 0.1–1.0)
Monocytes Relative: 10 %
Neutro Abs: 4 10*3/uL (ref 1.7–7.7)
Neutrophils Relative %: 54 %
Platelets: 310 10*3/uL (ref 150–400)
RBC: 3.73 MIL/uL — ABNORMAL LOW (ref 3.87–5.11)
RDW: 14.1 % (ref 11.5–15.5)
WBC: 7.4 10*3/uL (ref 4.0–10.5)
nRBC: 0 % (ref 0.0–0.2)

## 2019-03-14 LAB — FOLATE: Folate: 9.7 ng/mL (ref >5.9)

## 2019-03-14 LAB — SEDIMENTATION RATE: Sed Rate: 42 mm/hr — ABNORMAL HIGH (ref 0–30)

## 2019-03-14 LAB — VITAMIN B12: Vitamin B-12: 1137 pg/mL — ABNORMAL HIGH (ref 180–914)

## 2019-03-14 LAB — RETICULOCYTES
Immature Retic Fract: 6.8 % (ref 2.3–15.9)
RBC.: 3.79 MIL/uL — ABNORMAL LOW (ref 3.87–5.11)
Retic Count, Absolute: 46.2 10*3/uL (ref 19.0–186.0)
Retic Ct Pct: 1.2 % (ref 0.4–3.1)

## 2019-03-14 LAB — IRON AND TIBC
Iron: 33 ug/dL (ref 28–170)
Saturation Ratios: 7 % — ABNORMAL LOW (ref 10.4–31.8)
TIBC: 461 ug/dL — ABNORMAL HIGH (ref 250–450)
UIBC: 428 ug/dL

## 2019-03-14 LAB — FERRITIN: Ferritin: 11 ng/mL (ref 11–307)

## 2019-03-14 MED ORDER — FERROUS SULFATE 325 (65 FE) MG PO TBEC: 325.0000 mg | DELAYED_RELEASE_TABLET | Freq: Two times a day (BID) | ORAL | 0 refills | Status: DC

## 2019-03-14 NOTE — Patient Instructions (Addendum)
Begin ferrous sulfate 1 tablet a day with orange juice or vitamin C.   If tolerated, increase to 1 tablet twice a day.    If unable to tolerate oral iron, we will begin IV iron in 1 month. (see below)  Iron Sucrose injection What is this medicine? IRON SUCROSE (AHY ern SOO krohs) is an iron complex. Iron is used to make healthy red blood cells, which carry oxygen and nutrients throughout the body. This medicine is used to treat iron deficiency anemia in people with chronic kidney disease. This medicine may be used for other purposes; ask your health care provider or pharmacist if you have questions. COMMON BRAND NAME(S): Venofer What should I tell my health care provider before I take this medicine? They need to know if you have any of these conditions:  anemia not caused by low iron levels  heart disease  high levels of iron in the blood  kidney disease  liver disease  an unusual or allergic reaction to iron, other medicines, foods, dyes, or preservatives  pregnant or trying to get pregnant  breast-feeding How should I use this medicine? This medicine is for infusion into a vein. It is given by a health care professional in a hospital or clinic setting. Talk to your pediatrician regarding the use of this medicine in children. While this drug may be prescribed for children as young as 2 years for selected conditions, precautions do apply. Overdosage: If you think you have taken too much of this medicine contact a poison control center or emergency room at once. NOTE: This medicine is only for you. Do not share this medicine with others. What if I miss a dose? It is important not to miss your dose. Call your doctor or health care professional if you are unable to keep an appointment. What may interact with this medicine? Do not take this medicine with any of the following medications:  deferoxamine  dimercaprol  other iron products This medicine may also interact with  the following medications:  chloramphenicol  deferasirox This list may not describe all possible interactions. Give your health care provider a list of all the medicines, herbs, non-prescription drugs, or dietary supplements you use. Also tell them if you smoke, drink alcohol, or use illegal drugs. Some items may interact with your medicine. What should I watch for while using this medicine? Visit your doctor or healthcare professional regularly. Tell your doctor or healthcare professional if your symptoms do not start to get better or if they get worse. You may need blood work done while you are taking this medicine. You may need to follow a special diet. Talk to your doctor. Foods that contain iron include: whole grains/cereals, dried fruits, beans, or peas, leafy green vegetables, and organ meats (liver, kidney). What side effects may I notice from receiving this medicine? Side effects that you should report to your doctor or health care professional as soon as possible:  allergic reactions like skin rash, itching or hives, swelling of the face, lips, or tongue  breathing problems  changes in blood pressure  cough  fast, irregular heartbeat  feeling faint or lightheaded, falls  fever or chills  flushing, sweating, or hot feelings  joint or muscle aches/pains  seizures  swelling of the ankles or feet  unusually weak or tired Side effects that usually do not require medical attention (report to your doctor or health care professional if they continue or are bothersome):  diarrhea  feeling achy  headache  irritation at site where injected  nausea, vomiting  stomach upset  tiredness This list may not describe all possible side effects. Call your doctor for medical advice about side effects. You may report side effects to FDA at 1-800-FDA-1088. Where should I keep my medicine? This drug is given in a hospital or clinic and will not be stored at home. NOTE: This  sheet is a summary. It may not cover all possible information. If you have questions about this medicine, talk to your doctor, pharmacist, or health care provider.  2020 Elsevier/Gold Standard (2011-02-02 17:14:35)   Iron Deficiency Anemia, Adult Iron-deficiency anemia is when you have a low amount of red blood cells or hemoglobin. This happens because you have too little iron in your body. Hemoglobin carries oxygen to parts of the body. Anemia can cause your body to not get enough oxygen. It may or may not cause symptoms. Follow these instructions at home: Medicines  Take over-the-counter and prescription medicines only as told by your doctor. This includes iron pills (supplements) and vitamins.  If you cannot handle taking iron pills by mouth, ask your doctor about getting iron through: ? A vein (intravenously). ? A shot (injection) into a muscle.  Take iron pills when your stomach is empty. If you cannot handle this, take them with food.  Do not drink milk or take antacids at the same time as your iron pills.  To prevent trouble pooping (constipation), eat fiber or take medicine (stool softener) as told by your doctor. Eating and drinking   Talk with your doctor before changing the foods you eat. He or she may tell you to eat foods that have a lot of iron, such as: ? Liver. ? Lowfat (lean) beef. ? Breads and cereals that have iron added to them (fortified breads and cereals). ? Eggs. ? Dried fruit. ? Dark green, leafy vegetables.  Drink enough fluid to keep your pee (urine) clear or pale yellow.  Eat fresh fruits and vegetables that are high in vitamin C. They help your body to use iron. Foods with a lot of vitamin C include: ? Oranges. ? Peppers. ? Tomatoes. ? Mangoes. General instructions  Return to your normal activities as told by your doctor. Ask your doctor what activities are safe for you.  Keep yourself clean, and keep things clean around you (your surroundings).  Anemia can make you get sick more easily.  Keep all follow-up visits as told by your doctor. This is important. Contact a doctor if:  You feel sick to your stomach (nauseous).  You throw up (vomit).  You feel weak.  You are sweating for no clear reason.  You have trouble pooping, such as: ? Pooping (having a bowel movement) less than 3 times a week. ? Straining to poop. ? Having poop that is hard, dry, or larger than normal. ? Feeling full or bloated. ? Pain in the lower belly. ? Not feeling better after pooping. Get help right away if:  You pass out (faint). If this happens, do not drive yourself to the hospital. Call your local emergency services (911 in the [place]  You have chest pain.  You have shortness of breath that: ? Is very bad. ? Gets worse with physical activity.  You have a fast heartbeat.  You get light-headed when getting up from sitting or lying down. This information is not intended to replace advice given to you by your health care provider. Make sure you discuss any questions you have with  your health care provider. Document Released: 05/27/2010 Document Revised: 04/06/2017 Document Reviewed: 01/12/2016 Elsevier Patient Education  2020 [employer].

## 2019-03-14 NOTE — Progress Notes (Signed)
New patient visit for anemia in chronic kidney disease.  Pt reports having no energy, "worn out after simple activities of daily living.

## 2019-03-25 ENCOUNTER — Other Ambulatory Visit: Payer: Self-pay

## 2019-03-25 ENCOUNTER — Other Ambulatory Visit: Payer: Self-pay | Admitting: Hematology and Oncology

## 2019-03-25 ENCOUNTER — Telehealth: Payer: Self-pay

## 2019-03-25 MED ORDER — ONDANSETRON HCL 4 MG PO TABS: 4.0000 mg | ORAL_TABLET | Freq: Three times a day (TID) | ORAL | 0 refills | Status: DC | PRN

## 2019-03-25 NOTE — Telephone Encounter (Signed)
-----   Message from Lequita Asal, MD sent at 03/25/2019  1:29 PM EST ----- Regarding: RE: iron Contact: [phone]  Please call patient.  Why is she having nausea?  Rx for ondansetron sent in.  M ----- Message ----- From: Vito Berger, CMA Sent: 03/25/2019   9:02 AM EST To: Lequita Asal, MD Subject: Melton Alar: iron                                        ----- Message ----- From: Secundino Ginger Sent: 03/25/2019   8:22 AM EST To: Vito Berger, CMA Subject: RE: iron                                       Labs tomorrow, MD/ poss venofer Friday and she said she is so nauseated she would like something sent to pharmacy and let her know that you sent it. ----- Message ----- From: Vito Berger, [place]: 03/24/2019   3:44 PM EST To: Secundino Ginger Subject: RE: iron                                       Yes she will need labs, see MD and infusion ----- Message ----- From: Secundino Ginger Sent: 03/24/2019   3:32 PM EST To: Vito Berger, CMA Subject: RE: iron                                       I had to leave a msg. Will dr c need to see her that 1st time of venofer? ----- Message ----- From: Vito Berger, [place]: 03/24/2019  12:55 PM EST To: Secundino Ginger Subject: RE: iron                                       Do you want me to reach out to the patient or will you and get her schedule at the same time. ----- Message ----- From: Lequita Asal, MD Sent: 03/24/2019  12:15 PM EST To: Arlan Organ, RN, Secundino Ginger, # Subject: RE: iron                                        If she cannot tolerate oral iron, we can start weekly Venofer.  M ----- Message ----- From: Secundino Ginger Sent: 03/24/2019  12:04 PM EST To: Arlan Organ, RN, # Subject: iron                                           Amaiya said she cannot tolerate the iron pills she is taking.She is having diarrhea occassionly, bad headache, nausea,  vommiting and constipation.

## 2019-03-25 NOTE — Telephone Encounter (Signed)
The patient reports she is having Nausea and diarrhea due to the Iron she was started on. The patient reports the Nausea has been several and need something called in for the nausea. I have informed Dr Mike Gip and Dr Mike Gip was able to send in Zofran. I have left a message for the patient to inform her that the Zofran was e- scribe to the pharmacy.

## 2019-03-26 ENCOUNTER — Other Ambulatory Visit: Payer: Medicare Other

## 2019-03-28 ENCOUNTER — Inpatient Hospital Stay: Payer: Medicare Other

## 2019-03-28 ENCOUNTER — Inpatient Hospital Stay: Payer: Medicare Other | Admitting: Hematology and Oncology

## 2019-03-28 DIAGNOSIS — D509 Iron deficiency anemia, unspecified: Secondary | ICD-10-CM | POA: Insufficient documentation

## 2019-03-31 ENCOUNTER — Other Ambulatory Visit: Payer: Self-pay

## 2019-03-31 ENCOUNTER — Inpatient Hospital Stay: Payer: Medicare Other

## 2019-03-31 DIAGNOSIS — D649 Anemia, unspecified: Secondary | ICD-10-CM

## 2019-03-31 DIAGNOSIS — I129 Hypertensive chronic kidney disease with stage 1 through stage 4 chronic kidney disease, or unspecified chronic kidney disease: Secondary | ICD-10-CM | POA: Diagnosis not present

## 2019-03-31 LAB — CBC WITH DIFFERENTIAL/PLATELET
Abs Immature Granulocytes: 0.02 10*3/uL (ref 0.00–0.07)
Basophils Absolute: 0.1 10*3/uL (ref 0.0–0.1)
Basophils Relative: 1 %
Eosinophils Absolute: 0.1 10*3/uL (ref 0.0–0.5)
Eosinophils Relative: 1 %
HCT: 32.7 % — ABNORMAL LOW (ref 36.0–46.0)
Hemoglobin: 11.1 g/dL — ABNORMAL LOW (ref 12.0–15.0)
Immature Granulocytes: 0 %
Lymphocytes Relative: 38 %
Lymphs Abs: 3.1 10*3/uL (ref 0.7–4.0)
MCH: 28 pg (ref 26.0–34.0)
MCHC: 33.9 g/dL (ref 30.0–36.0)
MCV: 82.4 fL (ref 80.0–100.0)
Monocytes Absolute: 0.9 10*3/uL (ref 0.1–1.0)
Monocytes Relative: 11 %
Neutro Abs: 4 10*3/uL (ref 1.7–7.7)
Neutrophils Relative %: 49 %
Platelets: 373 10*3/uL (ref 150–400)
RBC: 3.97 MIL/uL (ref 3.87–5.11)
RDW: 14.6 % (ref 11.5–15.5)
WBC: 8.2 10*3/uL (ref 4.0–10.5)
nRBC: 0 % (ref 0.0–0.2)

## 2019-03-31 LAB — FERRITIN: Ferritin: 15 ng/mL (ref 11–307)

## 2019-03-31 NOTE — Progress Notes (Deleted)
[facility]  [address], [address] [place], [place] [address] Phone: [phone]  Fax: [phone]   Clinic Day:  03/31/2019  Referring physician: Ezequiel Kayser, MD  Chief Complaint: Jhania Etherington is a 78 y.o. female with anemia in chronic kidney disease who is seen for 2 week assessment and review of work up.   HPI: The patient was last seen in the hematology clinic on 03/14/2019 for initial consultation.  She has chronic kidney disease.  We discussed evaluation for potential other causes of anemia prior to initiation of Retacrit.   Symptomatically, she noted a sensitive stomach; she was not eating much and had lost 18 pounds over the past few months. Exam was unremarkable.  Outside labs suggested iron deficiency.  We discussed starting ferrous sulfate 325 mg daily with OJ or vitamin C. She continued oral B-12 500 mcg/day.    Work up revealed a hematocrit 31.8, hemoglobin 10.4, MCV 85.3, platelets 310,000, WBC 7,400. Ferritin was 11 with an iron saturation of 7% and a TIBC of 461.  Sed rate was 42. Folate was 9.7. Vitamin B-12 was 1,137.  Retic was 1.2%.  She contacted the clinic on 03/25/2019 about nausea and diarrhea after starting oral iron. She was prescribed ondansetron.   During the interim, ***   Past Medical History:  Diagnosis Date  . High cholesterol   . Hypertension   . Thyroid disease     Past Surgical History:  Procedure Laterality Date  . ABDOMINAL HYSTERECTOMY    . APPENDECTOMY    . BREAST BIOPSY Left 07/27/2004   neg  . BREAST SURGERY    . GALLBLADDER SURGERY    . herniated disc      Family History  Problem Relation Age of Onset  . Breast cancer Neg Hx     Social History:  reports that she has never smoked. She has never used smokeless tobacco. She reports that she does not use drugs. No history on file for alcohol.  Granddaughter live with her. She was a retired Clinical biochemist. She lives in [place].  The patient is ***alone/accompanied by *** today.  Allergies:  Allergies  Allergen Reactions  . Atorvastatin Other (See Comments)    Elevated LFT's  . Codeine Sulfate Other (See Comments)  . Meloxicam Diarrhea  . Other Hives    sporonox   . Simvastatin     Other reaction(s): Liver Disorder Elevated LFT's  . Sulfa Antibiotics Other (See Comments)    Current Medications: Current Outpatient Medications  Medication Sig Dispense Refill  . allopurinol (ZYLOPRIM) 300 MG tablet     . ammonium lactate (LAC-HYDRIN) 12 % lotion Comments:   Filled Date: Aug 25 2017 12:00AM  Duration: 20    . chlorthalidone (HYGROTON) 25 MG tablet Take 25 mg by mouth daily.    . Cyanocobalamin (VITAMIN B 12) 500 MCG TABS Take 500 mcg by mouth.    . diclofenac sodium (VOLTAREN) 1 % GEL Apply topically.    Mariane Baumgarten Sodium 100 MG capsule Take by mouth.    . ferrous sulfate 325 (65 FE) MG EC tablet Take 1 tablet (325 mg total) by mouth 2 (two) times daily with a meal. 60 tablet 0  . FLUoxetine (PROZAC) 20 MG capsule Take 20 mg by mouth daily.    . fluticasone (FLONASE) 50 MCG/ACT nasal spray     . furosemide (LASIX) 20 MG tablet     . HYDROmorphone (DILAUDID) 4 MG tablet     .  levothyroxine (SYNTHROID, LEVOTHROID) 112 MCG tablet Take 112 mcg by mouth daily before breakfast.    . lisinopril (ZESTRIL) 40 MG tablet Comments:   Filled Date: Jun 27 2017 12:00AM  Duration: 90    . metoprolol succinate (TOPROL-XL) 25 MG 24 hr tablet Comments:   Filled Date: Aug 25 2017 12:00AM  Duration: 90    . naloxone (NARCAN) 4 MG/0.1ML LIQD nasal spray kit     . omeprazole (PRILOSEC) 20 MG capsule     . ondansetron (ZOFRAN) 4 MG tablet Take 1 tablet (4 mg total) by mouth every 8 (eight) hours as needed for nausea or vomiting. 10 tablet 0  . polycarbophil (FIBERCON) 625 MG tablet Take by mouth.    . spironolactone (ALDACTONE) 25 MG tablet Take by mouth.    . traZODone (DESYREL) 50 MG tablet Take by mouth.    . Vitamin D,  Ergocalciferol, (DRISDOL) 50000 UNITS CAPS capsule     . Wheat Dextrin (BENEFIBER PO) Take by mouth.    Marland Kitchen ZETIA 10 MG tablet      No current facility-administered medications for this visit.     Review of Systems  Constitutional: Positive for weight loss (200 to 182 pounds over several months as not eating much). Negative for chills and malaise/fatigue.  HENT: Negative.  Negative for congestion, ear pain, hearing loss, nosebleeds, sinus pain and sore throat.   Eyes: Negative for blurred vision, double vision, photophobia and pain.       Slight decrease in vision (saw ophthalmologist recently).  Respiratory: Negative.  Negative for cough, hemoptysis, sputum production, shortness of breath and wheezing.   Cardiovascular: Negative.  Negative for chest pain, palpitations and leg swelling.  Gastrointestinal: Negative for abdominal pain, blood in stool, constipation, diarrhea, nausea and vomiting.       No eating much.  Stomach sensitive.  H/o diverticulitis.  Colonoscopy years ago.  Genitourinary: Negative.  Negative for dysuria, frequency, hematuria and urgency.  Musculoskeletal: Positive for joint pain (diffuse osteoarthritis) and neck pain (tendonitis). Negative for back pain, falls and myalgias.  Skin: Negative for rash.       Chronic dry skin.  Neurological: Negative.  Negative for dizziness, sensory change, speech change, focal weakness, weakness and headaches.  Endo/Heme/Allergies: Negative.   Psychiatric/Behavioral: Negative.  Negative for depression and memory loss. The patient is not nervous/anxious and does not have insomnia.     Performance status (ECOG): 1  Vitals There were no vitals taken for this visit.   Physical Exam  Constitutional: She is oriented to person, place, and time. She appears well-developed. No distress.  HENT:  Head: Normocephalic and atraumatic.  Mouth/Throat: Oropharynx is clear and moist. No oropharyngeal exudate.  Short styled blonde hair.  Mask.   Eyes: Pupils are equal, round, and reactive to light. Conjunctivae and EOM are normal. No scleral icterus.  Blue eyes.  Neck: Normal range of motion. Neck supple. No JVD present.  Cardiovascular: Normal rate, regular rhythm and normal heart sounds. Exam reveals no gallop.  No murmur heard. Pulmonary/Chest: Effort normal and breath sounds normal. No respiratory distress. She has no wheezes. She has no rales. She exhibits no tenderness.  Abdominal: Soft. Bowel sounds are normal. She exhibits no distension and no mass. There is no abdominal tenderness. There is no rebound and no guarding.  Musculoskeletal: Normal range of motion.        General: Edema (bilateral trace tender lower extremity) present.  Lymphadenopathy:       Head (right side): No  preauricular, no posterior auricular and no occipital adenopathy present.       Head (left side): No preauricular and no posterior auricular adenopathy present.    She has no cervical adenopathy.    She has no axillary adenopathy.       Right: No supraclavicular adenopathy present.       Left: No supraclavicular adenopathy present.  Neurological: She is alert and oriented to person, place, and time.  Skin: Skin is warm and dry. No rash noted. She is not diaphoretic. No erythema. No pallor.  Psychiatric: She has a normal mood and affect. Her behavior is normal. Judgment and thought content normal.  Nursing note and vitals reviewed.    No visits with results within 3 Day(s) from this visit.  Latest known visit with results is:  Office Visit on 03/14/2019  Component Date Value Ref Range Status  . Retic Ct Pct 03/14/2019 1.2  0.4 - 3.1 % Final  . RBC. 03/14/2019 3.79* 3.87 - 5.11 MIL/uL Final  . Retic Count, Absolute 03/14/2019 46.2  19.0 - 186.0 K/uL Final  . Immature Retic Fract 03/14/2019 6.8  2.3 - 15.9 % Final   Performed at [facility], [address], [place], [place] [address]  . Folate 03/14/2019 9.7  >5.9 ng/mL Final    Performed at [facility], [address] [place], [place] [address]  . Sed Rate 03/14/2019 42* 0 - 30 mm/hr Final   Performed at [facility], [address], [place], [place] [address]  . Iron 03/14/2019 33  28 - 170 ug/dL Final  . TIBC 03/14/2019 461* 250 - 450 ug/dL Final  . Saturation Ratios 03/14/2019 7* 10.4 - 31.8 % Final  . UIBC 03/14/2019 428  ug/dL Final   Performed at [facility], [address] [place], [place] [address]  . Ferritin 03/14/2019 11  11 - 307 ng/mL Final   Performed at [facility], [address] [place], [place] [address]  . WBC 03/14/2019 7.4  4.0 - 10.5 K/uL Final  . RBC 03/14/2019 3.73* 3.87 - 5.11 MIL/uL Final  . Hemoglobin 03/14/2019 10.4* 12.0 - 15.0 g/dL Final  . HCT 03/14/2019 31.8* 36.0 - 46.0 % Final  . MCV 03/14/2019 85.3  80.0 - 100.0 fL Final  . MCH 03/14/2019 27.9  26.0 - 34.0 pg Final  . MCHC 03/14/2019 32.7  30.0 - 36.0 g/dL Final  . RDW 03/14/2019 14.1  11.5 - 15.5 % Final  . Platelets 03/14/2019 310  150 - 400 K/uL Final  . nRBC 03/14/2019 0.0  0.0 - 0.2 % Final  . Neutrophils Relative % 03/14/2019 54  % Final  . Neutro Abs 03/14/2019 4.0  1.7 - 7.7 K/uL Final  . Lymphocytes Relative 03/14/2019 33  % Final  . Lymphs Abs 03/14/2019 2.4  0.7 - 4.0 K/uL Final  . Monocytes Relative 03/14/2019 10  % Final  . Monocytes Absolute 03/14/2019 0.8  0.1 - 1.0 K/uL Final  . Eosinophils Relative 03/14/2019 2  % Final  . Eosinophils Absolute 03/14/2019 0.1  0.0 - 0.5 K/uL Final  . Basophils Relative 03/14/2019 1  % Final  . Basophils Absolute 03/14/2019 0.1  0.0 - 0.1 K/uL Final  . Immature Granulocytes 03/14/2019 0  % Final  . Abs Immature Granulocytes 03/14/2019 0.03  0.00 - 0.07 K/uL Final   Performed at [facility], [address], [place], [place] [address]  . Vitamin B-12 03/14/2019 1,137* 180 -  914 pg/mL Final   Comment: (NOTE) This assay is not validated for testing neonatal or  myeloproliferative syndrome specimens for Vitamin B12 levels. Performed at [facility], [place] [address], [place], [place] [address]     Assessment:  Olina Melfi is a 78 y.o. female with anemia of chronic renal disease.  Creatinine was 1.9 on 01/09/2019 and 1.67 on 02/13/2019.  SPEP and random UPEP on 09/06/2017 revealed no monoclonal protein.  Renal ultrasound on 10/11/2017 revealed no acute findings.   Labs on 02/13/2019 revealed a hematocrit 29.6, hemoglobin 9.9, MCV 85.8, platelets 225,000, WBC 4700.  Work up on 03/14/2019 revealed a hematocrit 31.8, hemoglobin 10.4, MCV 85.3, platelets 310,000, WBC 7,400. Ferritin was 11 with an iron saturation of 7% and a TIBC of 461.  Sed rate was 42. Normal studies included: folate and B12.  Retic was 1.2%.  She has iron deficiency.  Diet is poor.  Last colonoscopy was 7 years ago.  She denies any melena, hematochezia, hematuria or vaginal bleeding.  Ferritin has been followed:  26 on 07/17/2016, 29 on 03/16/2015, 19 on 10/15/2017, 23 on 02/13/2019, and 11 on 03/14/2019.  Iron saturation was 10% with a TIBC of 370 on 02/13/2019.  She has B12 deficiency.  She has been on oral B12 for many years.  B12 was 762 on 07/17/2017 and 1137 on 03/14/2019. Folate was 9.7 on 03/14/2019.  Symptomatically, ***  Plan: 1.   Anemia of chronic renal disease             Patient has Cr 1.67 on 02/13/2019.             Discuss r/o other causes of anemia then consideration of Retacrit.             Begin Retacrit if iron stores normal and hemoglobin < 10.             Preauth Retacrit. 2.   Iron deficiency anemia             Ferritin was 23 (low) on 02/13/2019.             Etiology appears secondary to diet.             Concern for weight loss.             Last colonoscopy 7 years ago.             Patient denies any bleeding.             Begin ferrous sulfate 325 mg po q day with OJ or vitamin C.             Preauth Venofer. 4.   B12 deficiency              Patient on oral B12.             Check B12 and folate level. 5.   RTC in 1 month for MD assessment, labs (CBC with diff, ferritin), review of work-up and +/- Venofer.   I discussed the assessment and treatment plan with the patient.  The patient was provided an opportunity to ask questions and all were answered.  The patient agreed with the plan and demonstrated an understanding of the instructions.  The patient was advised to call back if the symptoms worsen or if the condition fails to improve as anticipated.  I provided *** minutes of face-to-face time during this this encounter and > 50% was spent counseling as documented under  my assessment and plan.    Lequita Asal, MD, PhD    03/31/2019, 12:00 AM  I, Selena Batten, am acting as scribe for [employer]. Mike Gip, MD, PhD.  {Add scribe attestation statement}

## 2019-04-01 ENCOUNTER — Encounter: Payer: Self-pay | Admitting: Oncology

## 2019-04-01 NOTE — Progress Notes (Signed)
Patient states that she has been feeling "rotten" since starting the oral iron. She wants to know if the IV iron would make her feel the same/worse/better. She has had nausea and 1-2 episodes of vomiting along with constipation and some diarrhea. Pt reports very poor appetite.

## 2019-04-02 ENCOUNTER — Other Ambulatory Visit: Payer: Self-pay

## 2019-04-02 ENCOUNTER — Inpatient Hospital Stay: Payer: Medicare Other

## 2019-04-02 ENCOUNTER — Inpatient Hospital Stay (HOSPITAL_BASED_OUTPATIENT_CLINIC_OR_DEPARTMENT_OTHER): Payer: Medicare Other | Admitting: Oncology

## 2019-04-02 VITALS — BP 148/72 | HR 63 | Temp 97.3°F | Ht 64.0 in | Wt 181.9 lb

## 2019-04-02 VITALS — BP 128/72 | HR 55 | Resp 16

## 2019-04-02 DIAGNOSIS — D649 Anemia, unspecified: Secondary | ICD-10-CM

## 2019-04-02 DIAGNOSIS — I129 Hypertensive chronic kidney disease with stage 1 through stage 4 chronic kidney disease, or unspecified chronic kidney disease: Secondary | ICD-10-CM | POA: Diagnosis not present

## 2019-04-02 DIAGNOSIS — D509 Iron deficiency anemia, unspecified: Secondary | ICD-10-CM

## 2019-04-02 MED ORDER — DEXAMETHASONE SODIUM PHOSPHATE 10 MG/ML IJ SOLN
4.0000 mg | Freq: Once | INTRAMUSCULAR | Status: AC
  Administered 2019-04-02: 4 mg via INTRAVENOUS

## 2019-04-02 MED ORDER — DIPHENHYDRAMINE HCL 50 MG/ML IJ SOLN
25.0000 mg | Freq: Once | INTRAMUSCULAR | Status: AC
  Administered 2019-04-02: 25 mg via INTRAVENOUS

## 2019-04-02 MED ORDER — DIPHENHYDRAMINE HCL 25 MG PO TABS
25.0000 mg | ORAL_TABLET | Freq: Once | ORAL | Status: DC
  Filled 2019-04-02: qty 1

## 2019-04-02 MED ORDER — SODIUM CHLORIDE 0.9 % IV SOLN: 200.0000 mg | Freq: Once | INTRAVENOUS | Status: DC

## 2019-04-02 MED ORDER — ONDANSETRON HCL 4 MG PO TABS: 4.0000 mg | ORAL_TABLET | Freq: Three times a day (TID) | ORAL | 1 refills | Status: DC | PRN

## 2019-04-02 MED ORDER — DEXAMETHASONE 4 MG PO TABS: 4.0000 mg | ORAL_TABLET | Freq: Once | ORAL | Status: DC

## 2019-04-02 MED ORDER — SODIUM CHLORIDE 0.9 % IV SOLN
Freq: Once | INTRAVENOUS | Status: AC
  Administered 2019-04-02: 12:00:00 via INTRAVENOUS
  Filled 2019-04-02: qty 250

## 2019-04-02 MED ORDER — SODIUM CHLORIDE 0.9 % IV SOLN: 4.0000 mg | Freq: Once | INTRAVENOUS | Status: DC

## 2019-04-02 MED ORDER — IRON SUCROSE 20 MG/ML IV SOLN
200.0000 mg | Freq: Once | INTRAVENOUS | Status: AC
  Administered 2019-04-02: 200 mg via INTRAVENOUS

## 2019-04-02 MED ORDER — ONDANSETRON HCL 4 MG/2ML IJ SOLN: 4.0000 mg | Freq: Once | INTRAMUSCULAR | Status: DC

## 2019-04-02 MED ORDER — ONDANSETRON HCL 4 MG/2ML IJ SOLN
4.0000 mg | Freq: Once | INTRAMUSCULAR | Status: AC
  Administered 2019-04-02: 4 mg via INTRAVENOUS

## 2019-04-02 NOTE — Patient Instructions (Signed)

## 2019-04-02 NOTE — Progress Notes (Signed)
The patient does not c/o diarrhea today or N & V. The patient reports she has stop taking the oral Iron pills due to diarrhea.

## 2019-04-02 NOTE — Progress Notes (Signed)
Premeds given as ordered and iron infused very slowly.  Patient complained of burning sensation with IV running at 25cc/hr.  Ice pack applied to arm.  Iron infused without reaction.  Patient discharged home.

## 2019-04-02 NOTE — Progress Notes (Signed)
[facility]  [address], [address] [place], [place] [address] Phone: [phone]  Fax: [phone]   Clinic Day:  04/02/2019  Referring physician: Ezequiel Kayser, MD  Chief Complaint: Peggy Phillips is a 78 y.o. female with anemia of chronic kidney disease and iron deficiency anemia who is here for follow-up and possible IV Venofer.  HPI:  The patient has stage III chronic kidney disease.  She was last seen in the nephrology clinic on 02/13/2019 to reestablish care.  She had last been seen in 10/2017.  She presented with worsening renal function.  Creatinine clearance was 28 ml/minute (previously 33 ml/min).  Creatinine fluctuation was felt secondary to diuretic usage and poor fluid intake.  Blood pressure was well controlled.  Creatinine has been followed: 1.48 on 09/04/2017, 1.52 on 10/15/2017, 1.9 on 07/17/2017, 1.9 on 07/08/2018, 1.9 on 01/09/2019, and 1.67 on 02/13/2019.  SPEP and random UPEP on 09/06/2017 revealed no monoclonal protein.  Renal ultrasound on 10/11/2017 revealed no acute findings.   She was last seen in hematology by Dr. Mike Gip on 03/14/2019 to discuss lab results and plan of care.  She was diagnosed with iron deficiency anemia and started on ferrous sulfate 325 mg daily with OJ or vitamin C.  She was scheduled to return to clinic in approximately 1 month to assess tolerance of oral iron, labs and possible IV Venofer.  She has been unaware of a diagnosis of anemia or iron deficiency.  She has a history of B12 deficiency and hypothyroidism.  She has been on oral B12 for many years.  B12 was 762 on 07/17/2017.  TSH was 2.952 (normal) on 01/09/2019.   Ferritin has been followed:  26 on 07/17/2016, 29 on 03/16/2015, 19 on 10/15/2017 and 23 on 02/13/2019.  Iron saturation was 10% with a TIBC of 370 on 02/13/2019.  CBC followed 07/17/2016:  Hematocrit 33.9, hemoglobin 11.5, MCV 90.4, WBC 5900, ANC 3,820.  01/29/2017:  Hematocrit 37.3, hemoglobin  12.5, MCV 92.1, platelets 328,000, WBC 6500.  07/17/2017:  Hematocrit 33.0, hemoglobin 10.9, MCV 87.1, platelets 278,000, WBC 6000.  09/04/2017:  Hematocrit 30.4, hemoglobin 11.0, MCV 88.0, platelets 260,000, WBC 5800. 02/13/2019:  Hematocrit 29.6, hemoglobin 9.9, MCV 85.8, platelets 225,000, WBC 4700. 03/14/2019: Hematocrit 31.8, hemoglobin 10.4, MCV 85.3, platelets 310,000, WBC 7400. 04/02/2019: Hematocrit 32.7, hemoglobin 11.1, MCV 82.4, platelets 373,000, WBC 8200.   Her diet doesn't consist of not much meat or vegetables. Her granddaughter lives with her and is vegetarian.  She eats cabbage, collards and broccoli. She has craving for sugar. She denies ice pica.  She denies restless legs.   Last colonoscopy was 7 years ago by Dr. Gustavo Lah (no report available).  Family history is notable for her mother who had lung cancer; she smoked.  In the interim, she reports "not doing well".  States approximately 1 week after beginning ferrous sulfate oral supplments she began to have nausea vomiting and diarrhea that was severe.  Zofran 4 mg every 8 hours was called in with some relief.  She has been unable to do anything for the past 1 to 2 weeks d/t symptoms.  She has been fatigued.  Her appetite has decreased.  She is very nervous to start IV Venofer today.  She wants to feel better but does not want to experience any additional side effects.    Past Medical History:  Diagnosis Date  . High cholesterol   . Hypertension   . Thyroid disease     Past Surgical History:  Procedure Laterality Date  . ABDOMINAL HYSTERECTOMY    . APPENDECTOMY    . BREAST BIOPSY Left 07/27/2004   neg  . BREAST SURGERY    . GALLBLADDER SURGERY    . herniated disc      Family History  Problem Relation Age of Onset  . Breast cancer Neg Hx     Social History:  reports that she has never smoked. She has never used smokeless tobacco. She reports that she does not use drugs. No history on file for alcohol.  Granddaughter live with her. She was a retired Clinical biochemist. She lives in [place].  The patient is alone  today.  Allergies:  Allergies  Allergen Reactions  . Atorvastatin Other (See Comments)    Elevated LFT's  . Codeine Sulfate Other (See Comments)  . Meloxicam Diarrhea  . Other Hives    sporonox   . Simvastatin     Other reaction(s): Liver Disorder Elevated LFT's  . Sulfa Antibiotics Other (See Comments)    Current Medications: Current Outpatient Medications  Medication Sig Dispense Refill  . allopurinol (ZYLOPRIM) 300 MG tablet     . ammonium lactate (LAC-HYDRIN) 12 % lotion Comments:   Filled Date: Aug 25 2017 12:00AM  Duration: 20    . chlorthalidone (HYGROTON) 25 MG tablet Take 25 mg by mouth daily.    . Cyanocobalamin (VITAMIN B 12) 500 MCG TABS Take 500 mcg by mouth.    . diclofenac sodium (VOLTAREN) 1 % GEL Apply topically.    Mariane Baumgarten Sodium 100 MG capsule Take by mouth.    Marland Kitchen FLUoxetine (PROZAC) 20 MG capsule Take 20 mg by mouth daily.    . fluticasone (FLONASE) 50 MCG/ACT nasal spray     . furosemide (LASIX) 20 MG tablet     . HYDROmorphone (DILAUDID) 4 MG tablet     . levothyroxine (SYNTHROID, LEVOTHROID) 112 MCG tablet Take 112 mcg by mouth daily before breakfast.    . lisinopril (ZESTRIL) 40 MG tablet Comments:   Filled Date: Jun 27 2017 12:00AM  Duration: 90    . metoprolol succinate (TOPROL-XL) 25 MG 24 hr tablet Comments:   Filled Date: Aug 25 2017 12:00AM  Duration: 90    . omeprazole (PRILOSEC) 20 MG capsule     . ondansetron (ZOFRAN) 4 MG tablet Take 1 tablet (4 mg total) by mouth every 8 (eight) hours as needed for nausea or vomiting. 10 tablet 0  . polycarbophil (FIBERCON) 625 MG tablet Take by mouth.    . spironolactone (ALDACTONE) 25 MG tablet Take by mouth.    . Vitamin D, Ergocalciferol, (DRISDOL) 50000 UNITS CAPS capsule     . Wheat Dextrin (BENEFIBER PO) Take by mouth.    Marland Kitchen ZETIA 10 MG tablet     . ferrous sulfate  325 (65 FE) MG EC tablet Take 1 tablet (325 mg total) by mouth 2 (two) times daily with a meal. (Patient not taking: Reported on 04/01/2019) 60 tablet 0  . naloxone (NARCAN) 4 MG/0.1ML LIQD nasal spray kit     . traZODone (DESYREL) 50 MG tablet Take by mouth.     No current facility-administered medications for this visit.     Review of Systems  Constitutional: Positive for malaise/fatigue and weight loss. Negative for chills and fever.  HENT: Negative for congestion, ear pain and tinnitus.   Eyes: Negative.  Negative for blurred vision and double vision.  Respiratory: Negative.  Negative for cough, sputum production and shortness of breath.  Cardiovascular: Negative.  Negative for chest pain, palpitations and leg swelling.  Gastrointestinal: Positive for diarrhea, nausea and vomiting. Negative for abdominal pain and constipation.  Genitourinary: Negative for dysuria, frequency and urgency.  Musculoskeletal: Negative for back pain and falls.  Skin: Negative.  Negative for rash.  Neurological: Positive for weakness. Negative for headaches.  Endo/Heme/Allergies: Negative.  Does not bruise/bleed easily.  Psychiatric/Behavioral: Negative for depression. The patient is nervous/anxious. The patient does not have insomnia.    Performance status (ECOG): 1  Vitals There were no vitals taken for this visit.  Physical Exam  Constitutional: She is oriented to person, place, and time. Vital signs are normal. She appears well-developed and well-nourished.  HENT:  Head: Normocephalic and atraumatic.  Eyes: Pupils are equal, round, and reactive to light.  Neck: Normal range of motion.  Cardiovascular: Normal rate and regular rhythm.  No murmur heard. Pulmonary/Chest: Effort normal and breath sounds normal. She has no wheezes.  Abdominal: Soft. Bowel sounds are normal. She exhibits no distension and no mass. There is no abdominal tenderness.  Musculoskeletal: Normal range of motion.         General: No edema.  Neurological: She is alert and oriented to person, place, and time.  Skin: Skin is warm and dry.  Psychiatric: She has a normal mood and affect. Her behavior is normal.    Appointment on 03/31/2019  Component Date Value Ref Range Status  . WBC 03/31/2019 8.2  4.0 - 10.5 K/uL Final  . RBC 03/31/2019 3.97  3.87 - 5.11 MIL/uL Final  . Hemoglobin 03/31/2019 11.1* 12.0 - 15.0 g/dL Final  . HCT 03/31/2019 32.7* 36.0 - 46.0 % Final  . MCV 03/31/2019 82.4  80.0 - 100.0 fL Final  . MCH 03/31/2019 28.0  26.0 - 34.0 pg Final  . MCHC 03/31/2019 33.9  30.0 - 36.0 g/dL Final  . RDW 03/31/2019 14.6  11.5 - 15.5 % Final  . Platelets 03/31/2019 373  150 - 400 K/uL Final  . nRBC 03/31/2019 0.0  0.0 - 0.2 % Final  . Neutrophils Relative % 03/31/2019 49  % Final  . Neutro Abs 03/31/2019 4.0  1.7 - 7.7 K/uL Final  . Lymphocytes Relative 03/31/2019 38  % Final  . Lymphs Abs 03/31/2019 3.1  0.7 - 4.0 K/uL Final  . Monocytes Relative 03/31/2019 11  % Final  . Monocytes Absolute 03/31/2019 0.9  0.1 - 1.0 K/uL Final  . Eosinophils Relative 03/31/2019 1  % Final  . Eosinophils Absolute 03/31/2019 0.1  0.0 - 0.5 K/uL Final  . Basophils Relative 03/31/2019 1  % Final  . Basophils Absolute 03/31/2019 0.1  0.0 - 0.1 K/uL Final  . Immature Granulocytes 03/31/2019 0  % Final  . Abs Immature Granulocytes 03/31/2019 0.02  0.00 - 0.07 K/uL Final   Performed at [facility], [address], [place], [place] [address]  . Ferritin 03/31/2019 15  11 - 307 ng/mL Final   Performed at [facility], [address] [place], [place] [address]    Assessment:  Peggy Phillips is a 78 y.o. female with anemia of chronic renal disease.  Creatinine was 1.9 on 01/09/2019 and 1.67 on 02/13/2019.  SPEP and random UPEP on 09/06/2017 revealed no monoclonal protein.  Renal ultrasound on 10/11/2017 revealed no acute findings.   Labs on 02/13/2019 revealed a hematocrit 29.6, hemoglobin 9.9, MCV  85.8, platelets 225,000, WBC 4700.  She has iron deficiency.  Diet is poor.  Last colonoscopy was  7 years ago.  She denies any melena, hematochezia, hematuria or vaginal bleeding. Ferritin has been followed:  26 on 07/17/2016, 29 on 03/16/2015, 19 on 10/15/2017 and 23 on 02/13/2019.  Iron saturation was 10% with a TIBC of 370 on 02/13/2019.  She has B12 deficiency.  She has been on oral B12 for many years.  B12 was 762 on 07/17/2017.   Symptomatically, she had 1 to 2 weeks of nausea, vomiting and diarrhea due to oral iron tablets.  Today, is the first day she has felt "back to normal". She fatigued. She has lost 1 lb.  Exam is unremarkable.  Plan: 1.   Labs today:  CBC with diff, iron 2.   Anemia of chronic renal disease  Patient has Cr 1.67 on 02/13/2019.  Discuss r/o other causes of anemia then consideration of Retacrit.  Begin Retacrit if iron stores normal and hemoglobin < 10.  Will not require Retacrit today. Hemoglobin improved and is 11.1.  3.   Iron deficiency  Ferritin was 15 and hemoglobin was 11.1 on 03/31/19.  Etiology appears secondary to diet.  Concern for weight loss.  Last colonoscopy 7 years ago.  Patient denies any bleeding.  Unable to tolerate ferrous sulfate 325 mg p.o. daily with OJ or vitamin C.  Stopped taking  approximately 1 week ago.  Give first dose IV Venofer today.  Given patient's inability to tolerate p.o. iron and sensitivity to medications, will premedicate with 4  mg dexamethasone, 4 mg Zofran and 25 mg Benadryl.  RX zofran 4 mg refilled.  4.   B12 deficiency  Continue oral B12. 5.   RTC in 1 week for additional IV venofer.   I discussed the assessment and treatment plan with the patient.  The patient was provided an opportunity to ask questions and all were answered.  The patient agreed with the plan and demonstrated an understanding of the instructions.  The patient was advised to call back if the symptoms worsen or if the condition fails to improve as  anticipated.  Greater than 50% was spent in counseling and coordination of care with this patient including but not limited to discussion of the relevant topics above (See A&P) including, but not limited to diagnosis and management of acute and chronic medical conditions.   Faythe Casa, NP 04/02/2019 11:42 AM

## 2019-04-08 ENCOUNTER — Other Ambulatory Visit: Payer: Self-pay

## 2019-04-08 ENCOUNTER — Inpatient Hospital Stay: Payer: Medicare Other | Attending: Hematology and Oncology

## 2019-04-08 ENCOUNTER — Encounter: Payer: Self-pay | Admitting: Hematology and Oncology

## 2019-04-08 DIAGNOSIS — E538 Deficiency of other specified B group vitamins: Secondary | ICD-10-CM | POA: Insufficient documentation

## 2019-04-08 DIAGNOSIS — R197 Diarrhea, unspecified: Secondary | ICD-10-CM | POA: Insufficient documentation

## 2019-04-08 DIAGNOSIS — Z79899 Other long term (current) drug therapy: Secondary | ICD-10-CM | POA: Insufficient documentation

## 2019-04-08 DIAGNOSIS — R110 Nausea: Secondary | ICD-10-CM | POA: Diagnosis not present

## 2019-04-08 DIAGNOSIS — R5383 Other fatigue: Secondary | ICD-10-CM | POA: Insufficient documentation

## 2019-04-08 DIAGNOSIS — D649 Anemia, unspecified: Secondary | ICD-10-CM

## 2019-04-08 DIAGNOSIS — D631 Anemia in chronic kidney disease: Secondary | ICD-10-CM | POA: Diagnosis not present

## 2019-04-08 DIAGNOSIS — E7800 Pure hypercholesterolemia, unspecified: Secondary | ICD-10-CM | POA: Insufficient documentation

## 2019-04-08 DIAGNOSIS — R634 Abnormal weight loss: Secondary | ICD-10-CM | POA: Diagnosis not present

## 2019-04-08 DIAGNOSIS — I129 Hypertensive chronic kidney disease with stage 1 through stage 4 chronic kidney disease, or unspecified chronic kidney disease: Secondary | ICD-10-CM | POA: Diagnosis present

## 2019-04-08 DIAGNOSIS — E611 Iron deficiency: Secondary | ICD-10-CM | POA: Diagnosis not present

## 2019-04-08 DIAGNOSIS — N189 Chronic kidney disease, unspecified: Secondary | ICD-10-CM | POA: Insufficient documentation

## 2019-04-08 LAB — CBC WITH DIFFERENTIAL/PLATELET
Abs Immature Granulocytes: 0.02 10*3/uL (ref 0.00–0.07)
Basophils Absolute: 0 10*3/uL (ref 0.0–0.1)
Basophils Relative: 1 %
Eosinophils Absolute: 0.1 10*3/uL (ref 0.0–0.5)
Eosinophils Relative: 1 %
HCT: 32.5 % — ABNORMAL LOW (ref 36.0–46.0)
Hemoglobin: 10.7 g/dL — ABNORMAL LOW (ref 12.0–15.0)
Immature Granulocytes: 0 %
Lymphocytes Relative: 39 %
Lymphs Abs: 2.7 10*3/uL (ref 0.7–4.0)
MCH: 27.9 pg (ref 26.0–34.0)
MCHC: 32.9 g/dL (ref 30.0–36.0)
MCV: 84.6 fL (ref 80.0–100.0)
Monocytes Absolute: 0.7 10*3/uL (ref 0.1–1.0)
Monocytes Relative: 10 %
Neutro Abs: 3.3 10*3/uL (ref 1.7–7.7)
Neutrophils Relative %: 49 %
Platelets: 319 10*3/uL (ref 150–400)
RBC: 3.84 MIL/uL — ABNORMAL LOW (ref 3.87–5.11)
RDW: 15.1 % (ref 11.5–15.5)
WBC: 6.8 10*3/uL (ref 4.0–10.5)
nRBC: 0 % (ref 0.0–0.2)

## 2019-04-08 LAB — FERRITIN: Ferritin: 106 ng/mL (ref 11–307)

## 2019-04-08 NOTE — Progress Notes (Signed)
No new changes noted today. The patient Name and DOB has been verified by phone today. 

## 2019-04-08 NOTE — Progress Notes (Signed)
[facility]  [address] [place], [place] [address] Phone: [phone]  Fax: [phone]   Telemedicine Office Visit:  04/09/2019  Referring physician: Ezequiel Kayser, MD  I connected with Marton Redwood on 04/09/2019 at 11:27 AM by videoconferencing and verified that I was speaking with the correct person using 2 identifiers.  The patient was at home.  I discussed the limitations, risk, security and privacy concerns of performing an evaluation and management service by videoconferencing and the availability of in person appointments.  I also discussed with the patient that there may be a patient responsible charge related to this service.  The patient expressed understanding and agreed to proceed.   Chief Complaint: Peggy Phillips is a 78 y.o. female with anemia in chronic kidney disease who is seen for 1 month assessment.   HPI: The patient was last seen in the hematology clinic on 03/14/2019 for an initial consult. At that time, she noted a sensitive stomach, not eating much, and a weight loss of 18 pounds over the past few months.  Exam was unremarkable. We discussed initiation of Retacrit of iron stores were normal and hemoglobin is < 10. She was advised to begin ferrous sulfate 325 mg daily with OJ or vitamin C. She continued oral B-12 500 mcg.    Work up revealed a hematocrit 31.8, hemoglobin 10.4, MCV 85.3, platelets 310,000, WBC 7,400. Ferritin was 11 with an iron saturation of 7% and a TIBC of 461. Folate was 9.7 and vitamin B-12 was 1,137. Retic was 1.2%. Sed rate was 42.   Patient called the clinic on 03/25/2019 and reported nausea and diarrhea secondary to oral iron. Patient was prescribed ondansetron.   Labs on 03/31/2019: hematocrit 32.7, hemoglobin 11.1, MCV 82.4, platelets 373,000, WBC 8,200. Ferritin 15.  She was seen by Faythe Casa, NP on 04/02/2019.  At that time, she had 1 to 2 weeks of nausea, vomiting and diarrhea due to oral  iron tablets. She stopped taking oral iron.  It was the first day she had felt "back to normal".  She was fatigued. She had lost 1 lb.  Exam was unremarkable. Patient received her first dose of IV Venofer. She continued oral B-12 500 mcg/day.   During the interim, the patient was "doing alright".  Overall, she has felt "so much better over the past 2 weeks".  She notes a stable weight. She is eating better. She was not able to tolerate oral iron. She has constipation. She has tolerated IV iron with premedications. She describes a burning sensation during her infusion which improved with an ice pack. She reports feeling much better. Neck pain comes and goes. She notes joint pain.    Past Medical History:  Diagnosis Date  . High cholesterol   . Hypertension   . Thyroid disease     Past Surgical History:  Procedure Laterality Date  . ABDOMINAL HYSTERECTOMY    . APPENDECTOMY    . BREAST BIOPSY Left 07/27/2004   neg  . BREAST SURGERY    . GALLBLADDER SURGERY    . herniated disc      Family History  Problem Relation Age of Onset  . Breast cancer Neg Hx     Social History:  reports that she has never smoked. She has never used smokeless tobacco. She reports that she does not use drugs. No history on file for alcohol. Granddaughter live with her. She was a retired Clinical biochemist. She lives in [place].  The patient is alone today.  Participants in the patient's visit and their role in the encounter included the patient and Vito Berger, CMA, today.  The intake visit was provided by Vito Berger, CMA.  Allergies:  Allergies  Allergen Reactions  . Atorvastatin Other (See Comments)    Elevated LFT's  . Codeine Sulfate Other (See Comments)  . Meloxicam Diarrhea  . Other Hives    sporonox   . Simvastatin     Other reaction(s): Liver Disorder Elevated LFT's  . Sulfa Antibiotics Other (See Comments)    Current Medications: Current Outpatient Medications   Medication Sig Dispense Refill  . allopurinol (ZYLOPRIM) 300 MG tablet     . ammonium lactate (LAC-HYDRIN) 12 % lotion Comments:   Filled Date: Aug 25 2017 12:00AM  Duration: 20    . chlorthalidone (HYGROTON) 25 MG tablet Take 25 mg by mouth daily.    . Cyanocobalamin (VITAMIN B 12) 500 MCG TABS Take 500 mcg by mouth.    . diclofenac sodium (VOLTAREN) 1 % GEL Apply topically.    Mariane Baumgarten Sodium 100 MG capsule Take by mouth.    Marland Kitchen FLUoxetine (PROZAC) 20 MG capsule Take 20 mg by mouth daily.    . fluticasone (FLONASE) 50 MCG/ACT nasal spray     . furosemide (LASIX) 20 MG tablet     . HYDROmorphone (DILAUDID) 4 MG tablet     . levothyroxine (SYNTHROID, LEVOTHROID) 112 MCG tablet Take 112 mcg by mouth daily before breakfast.    . lisinopril (ZESTRIL) 40 MG tablet Comments:   Filled Date: Jun 27 2017 12:00AM  Duration: 90    . metoprolol succinate (TOPROL-XL) 25 MG 24 hr tablet Comments:   Filled Date: Aug 25 2017 12:00AM  Duration: 90    . naloxone (NARCAN) 4 MG/0.1ML LIQD nasal spray kit     . omeprazole (PRILOSEC) 20 MG capsule     . ondansetron (ZOFRAN) 4 MG tablet Take 1 tablet (4 mg total) by mouth every 8 (eight) hours as needed for nausea or vomiting. 30 tablet 1  . polycarbophil (FIBERCON) 625 MG tablet Take by mouth.    . spironolactone (ALDACTONE) 25 MG tablet Take by mouth.    . traZODone (DESYREL) 50 MG tablet Take by mouth.    . Vitamin D, Ergocalciferol, (DRISDOL) 50000 UNITS CAPS capsule     . Wheat Dextrin (BENEFIBER PO) Take by mouth.    Marland Kitchen ZETIA 10 MG tablet      No current facility-administered medications for this visit.     Review of Systems  Constitutional: Negative for chills, malaise/fatigue and weight loss (stable).       Feels "alright". Feels much better over past 2 weeks.  HENT: Negative.  Negative for congestion, ear pain, hearing loss, nosebleeds, sinus pain and sore throat.   Eyes: Negative for blurred vision, double vision, photophobia and pain.        Slight decrease in vision (saw ophthalmologist recently).  Respiratory: Negative.  Negative for cough, hemoptysis, sputum production, shortness of breath and wheezing.   Cardiovascular: Negative.  Negative for chest pain, palpitations and leg swelling.  Gastrointestinal: Positive for constipation. Negative for abdominal pain, blood in stool, diarrhea, nausea and vomiting.       Eating better. Stomach sensitive.  H/o diverticulitis.  Colonoscopy years ago.  Genitourinary: Negative.  Negative for dysuria, frequency, hematuria and urgency.  Musculoskeletal: Positive for joint pain (diffuse osteoarthritis) and neck pain (tendonitis). Negative for back pain, falls and myalgias.  Skin:  Negative for rash.       Chronic dry skin.  Neurological: Negative.  Negative for dizziness, sensory change, speech change, focal weakness, weakness and headaches.  Endo/Heme/Allergies: Negative.  Does not bruise/bleed easily.  Psychiatric/Behavioral: Negative.  Negative for depression and memory loss. The patient is not nervous/anxious and does not have insomnia.   All other systems reviewed and are negative.  Performance status (ECOG):  1  Physical Exam  Constitutional: She is oriented to person, place, and time. She appears well-developed and well-nourished. No distress.  HENT:  Head: Normocephalic and atraumatic.  Short styled blonde hair.  Eyes: Conjunctivae and EOM are normal. No scleral icterus.  Glasses.  Blue eyes.  Neurological: She is alert and oriented to person, place, and time.  Skin: She is not diaphoretic.  Psychiatric: She has a normal mood and affect. Her behavior is normal. Judgment and thought content normal.  Nursing note reviewed.   Orders Only on 04/08/2019  Component Date Value Ref Range Status  . Ferritin 04/08/2019 106  11 - 307 ng/mL Final   Performed at [facility], [address] [place], [place] [address]  . WBC 04/08/2019 6.8  4.0 - 10.5 K/uL Final  . RBC  04/08/2019 3.84* 3.87 - 5.11 MIL/uL Final  . Hemoglobin 04/08/2019 10.7* 12.0 - 15.0 g/dL Final  . HCT 04/08/2019 32.5* 36.0 - 46.0 % Final  . MCV 04/08/2019 84.6  80.0 - 100.0 fL Final  . MCH 04/08/2019 27.9  26.0 - 34.0 pg Final  . MCHC 04/08/2019 32.9  30.0 - 36.0 g/dL Final  . RDW 04/08/2019 15.1  11.5 - 15.5 % Final  . Platelets 04/08/2019 319  150 - 400 K/uL Final  . nRBC 04/08/2019 0.0  0.0 - 0.2 % Final  . Neutrophils Relative % 04/08/2019 49  % Final  . Neutro Abs 04/08/2019 3.3  1.7 - 7.7 K/uL Final  . Lymphocytes Relative 04/08/2019 39  % Final  . Lymphs Abs 04/08/2019 2.7  0.7 - 4.0 K/uL Final  . Monocytes Relative 04/08/2019 10  % Final  . Monocytes Absolute 04/08/2019 0.7  0.1 - 1.0 K/uL Final  . Eosinophils Relative 04/08/2019 1  % Final  . Eosinophils Absolute 04/08/2019 0.1  0.0 - 0.5 K/uL Final  . Basophils Relative 04/08/2019 1  % Final  . Basophils Absolute 04/08/2019 0.0  0.0 - 0.1 K/uL Final  . Immature Granulocytes 04/08/2019 0  % Final  . Abs Immature Granulocytes 04/08/2019 0.02  0.00 - 0.07 K/uL Final   Performed at [facility], [address], [place], [place] [address]    Assessment:  Marjan Rosman is a 78 y.o. female with anemia of chronic renal disease.  Creatinine was 1.9 on 01/09/2019 and 1.67 on 02/13/2019.  SPEP and random UPEP on 09/06/2017 revealed no monoclonal protein.  Renal ultrasound on 10/11/2017 revealed no acute findings.   Labs on 02/13/2019 revealed a hematocrit 29.6, hemoglobin 9.9, MCV 85.8, platelets 225,000, WBC 4700.  Work up on 03/14/2019 revealed a hematocrit 31.8, hemoglobin 10.4, MCV 85.3, platelets 310,000, WBC 7,400. Ferritin was 11 with an iron saturation of 7% and a TIBC of 461. B12 and folate were normal.  Retic was 1.2%. Sed rate was 42.  She has iron deficiency.  Diet is poor.  Last colonoscopy was 7 years ago.  She denies any melena, hematochezia, hematuria or vaginal bleeding.  She is intolerant of oral  iron.  She received Venofer on 04/02/2019.  She received  premedications (Decadron 4 mg, ondansetron 4 mg, and Benadryl 25 mg) secondary to her sensitivities with medications.  Ferritin has been followed:  26 on 07/17/2016, 29 on 03/16/2015, 19 on 10/15/2017, 23 on 02/13/2019, 11 on 03/14/2019, 15 on 03/31/2019, and 106 on 04/08/2019.  Iron saturation was 7% with a TIBC of 461 on 03/14/2019.  She has B12 deficiency.  She has been on oral B12 for many years.  B12 was 762 on 07/17/2017 and 1137 on 03/14/2019.  Folate was 9.7 on 03/14/2019  Symptomatically,she is doing well.  She has felt much better since discontinuation of oral iron and initiation of IV iron.  Weight is stable.  Plan: 1.   Review interim labs. 2.   Anemia of chronic renal disease             Creatinine was 1.67 on 02/13/2019.             Review plan to begin Retacrit when iron stores are normal and hemoglobin < 10.             Continue surveillance. 3.   Iron deficiency             Hematocrit 32.5.  Hemoglobin 10.7.  MCV 84.6 on 04/08/2019.   Ferritin was 23 (low) on 02/13/2019.   Ferritin 106 on 04/08/2019.             Patient intolerant of oral iron.  She is s/p Venofer on 04/02/2019.  Etiology appears secondary to diet.              Last colonoscopy 7 years ago.              Patient denies any bleeding.             No Venofer needed today (ferritin may be falsely elevated after initial Venofer). 4.   B12 deficiency             Patient on oral B12.             B12 was 1137 and and folate 9.7 on 03/14/2019. 5.   RTC in 2 weeks for labs (CBC, ferritin, iron studies). 6.   RN to call patient with labs and if additional IV iron needed. 7.   RTC in 2 months for MD assessment, labs (CBC with diff, ferritin, iron studies), and +/- Venofer or Retacrit.  I discussed the assessment and treatment plan with the patient.  The patient was provided an opportunity to ask questions and all were answered.  The patient agreed with the  plan and demonstrated an understanding of the instructions.  The patient was advised to call back if the symptoms worsen or if the condition fails to improve as anticipated.  I provided 13 minutes (11:27 AM - 11:40 AM) of face-to-face video visit time during this this encounter and > 50% was spent counseling as documented under my assessment and plan.  I provided these services from the [facility] office.    Lequita Asal, MD, PhD    04/09/2019, 11:27 AM  I, Selena Batten, am acting as scribe for [employer]. Mike Gip, MD, PhD.  I, Melissa C. Mike Gip, MD, have reviewed the above documentation for accuracy and completeness, and I agree with the above.

## 2019-04-09 ENCOUNTER — Other Ambulatory Visit: Payer: Medicare Other

## 2019-04-09 ENCOUNTER — Encounter: Payer: Self-pay | Admitting: Hematology and Oncology

## 2019-04-09 ENCOUNTER — Inpatient Hospital Stay: Payer: Medicare Other

## 2019-04-09 ENCOUNTER — Inpatient Hospital Stay (HOSPITAL_BASED_OUTPATIENT_CLINIC_OR_DEPARTMENT_OTHER): Payer: Medicare Other | Admitting: Hematology and Oncology

## 2019-04-09 DIAGNOSIS — D509 Iron deficiency anemia, unspecified: Secondary | ICD-10-CM

## 2019-04-09 DIAGNOSIS — E538 Deficiency of other specified B group vitamins: Secondary | ICD-10-CM

## 2019-04-09 DIAGNOSIS — N183 Chronic kidney disease, stage 3 unspecified: Secondary | ICD-10-CM | POA: Diagnosis not present

## 2019-04-10 ENCOUNTER — Ambulatory Visit: Payer: Medicare Other

## 2019-04-10 ENCOUNTER — Ambulatory Visit: Payer: Medicare Other | Admitting: Hematology and Oncology

## 2019-04-22 ENCOUNTER — Inpatient Hospital Stay: Payer: Medicare Other

## 2019-04-22 ENCOUNTER — Other Ambulatory Visit: Payer: Self-pay

## 2019-04-22 DIAGNOSIS — D509 Iron deficiency anemia, unspecified: Secondary | ICD-10-CM

## 2019-04-22 DIAGNOSIS — I129 Hypertensive chronic kidney disease with stage 1 through stage 4 chronic kidney disease, or unspecified chronic kidney disease: Secondary | ICD-10-CM | POA: Diagnosis not present

## 2019-04-22 DIAGNOSIS — E538 Deficiency of other specified B group vitamins: Secondary | ICD-10-CM

## 2019-04-22 DIAGNOSIS — N183 Chronic kidney disease, stage 3 unspecified: Secondary | ICD-10-CM

## 2019-04-22 LAB — IRON AND TIBC
Iron: 71 ug/dL (ref 28–170)
Saturation Ratios: 17 % (ref 10.4–31.8)
TIBC: 425 ug/dL (ref 250–450)
UIBC: 355 ug/dL

## 2019-04-22 LAB — CBC
HCT: 35.2 % — ABNORMAL LOW (ref 36.0–46.0)
Hemoglobin: 11.5 g/dL — ABNORMAL LOW (ref 12.0–15.0)
MCH: 28.3 pg (ref 26.0–34.0)
MCHC: 32.7 g/dL (ref 30.0–36.0)
MCV: 86.5 fL (ref 80.0–100.0)
Platelets: 312 10*3/uL (ref 150–400)
RBC: 4.07 MIL/uL (ref 3.87–5.11)
RDW: 15.6 % — ABNORMAL HIGH (ref 11.5–15.5)
WBC: 6.6 10*3/uL (ref 4.0–10.5)
nRBC: 0 % (ref 0.0–0.2)

## 2019-04-22 LAB — FERRITIN: Ferritin: 51 ng/mL (ref 11–307)

## 2019-04-28 ENCOUNTER — Telehealth: Payer: Self-pay

## 2019-04-28 NOTE — Telephone Encounter (Signed)
Contacted patient and informed her, per Dr. Mike Gip, labs are okay and no need for IV Iron at this time. Patient verbalizes understanding and denies any further questions or concerns.

## 2019-04-28 NOTE — Telephone Encounter (Signed)
-----   Message from Lequita Asal, MD sent at 04/28/2019  5:07 PM EST ----- Regarding: RE: lab results Contact: [phone]  Please call patient.  Labs ok.  No need for iron right now.  M ----- Message ----- From: Arlan Organ, RN Sent: 04/28/2019   5:04 PM EST To: Lequita Asal, MD Subject: Melton Alar: lab results                                 ----- Message ----- From: Secundino Ginger Sent: 04/28/2019  10:57 AM EST To: Arlan Organ, RN, # Subject: lab results                                    Please call patient with lab results. She asked if she needed more iron treatments. Leave a message if you get her answering machine.

## 2019-05-30 IMAGING — US US RENAL
1 series · 14 of 25 positions shown · non-contrast
Comparison: None.

CLINICAL DATA: CKD stage 3.

EXAM:
RENAL / URINARY TRACT ULTRASOUND COMPLETE

[Series 1: us renal · 0.23mm/px · 14 of 37 slices shown]
[im 1/37]
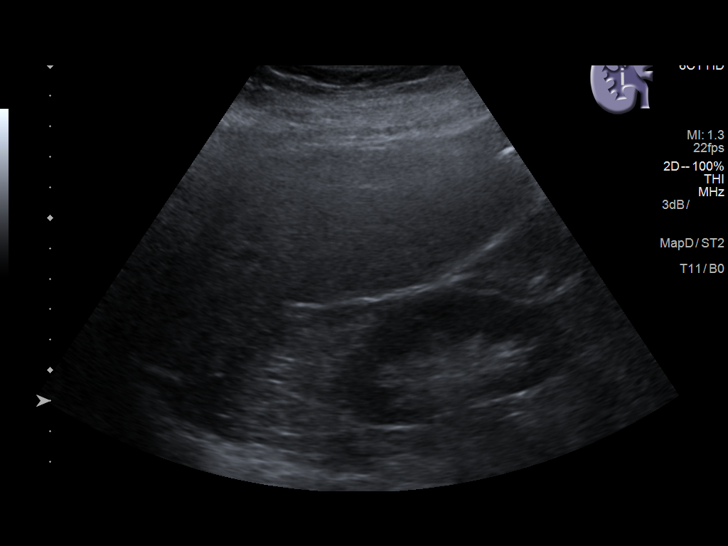
[im 4/37]
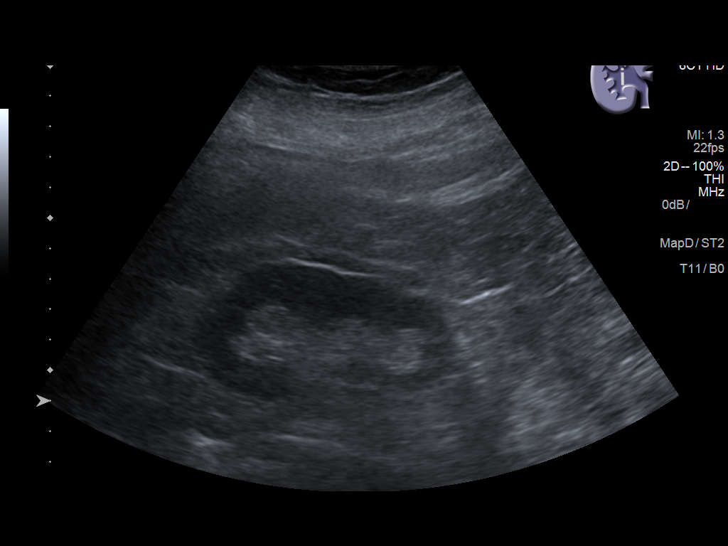
[im 7/37]
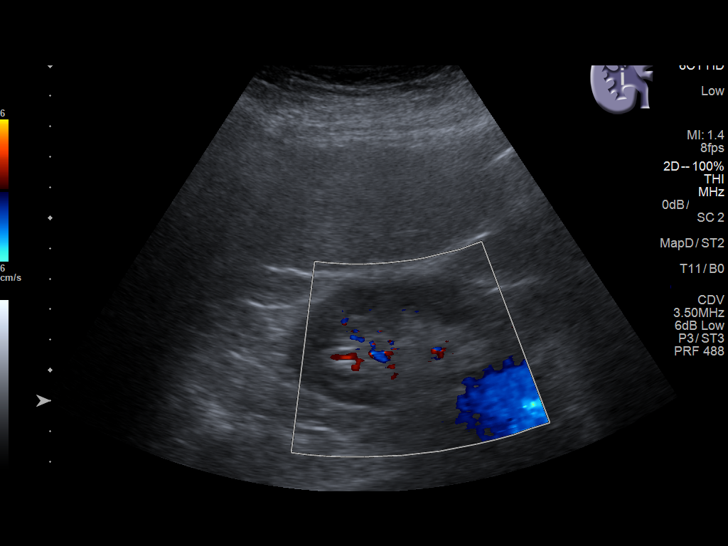
[im 10/37]
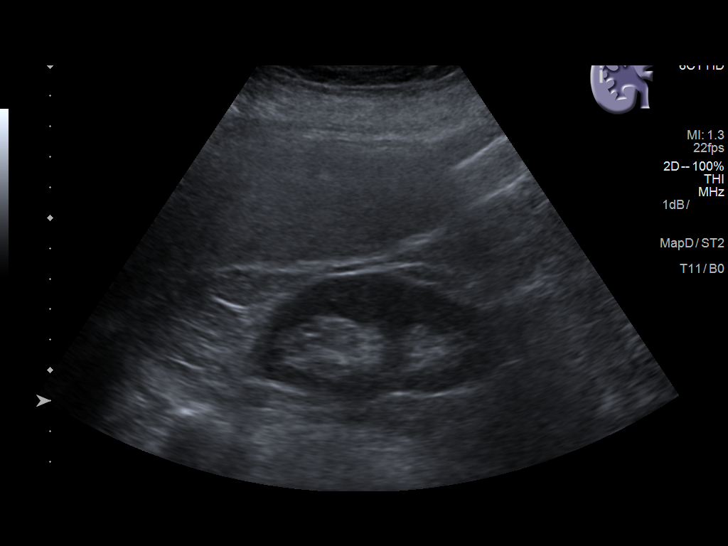
[im 13/37]
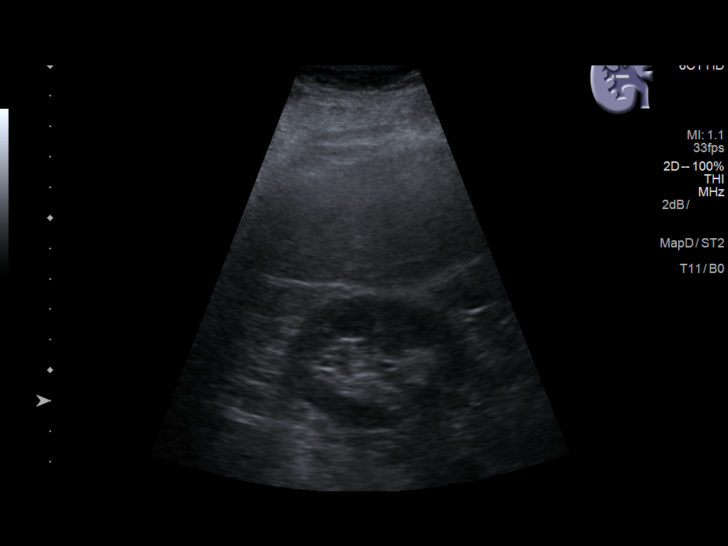
[im 14/37]
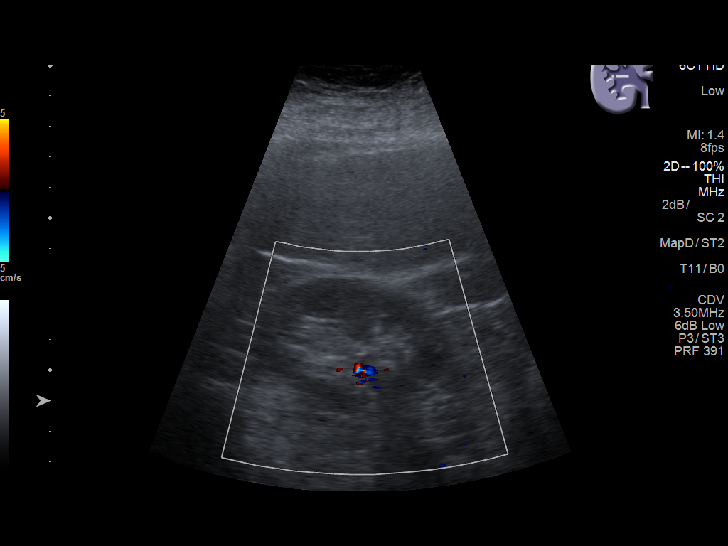
[im 17/37]
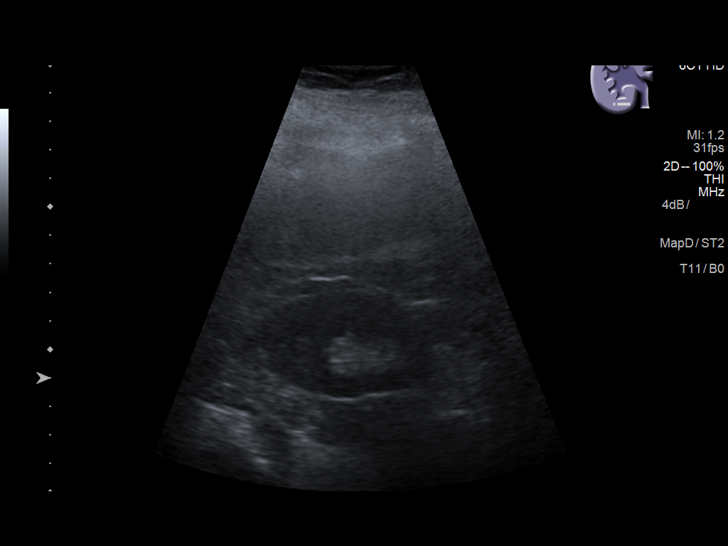
[im 20/37]
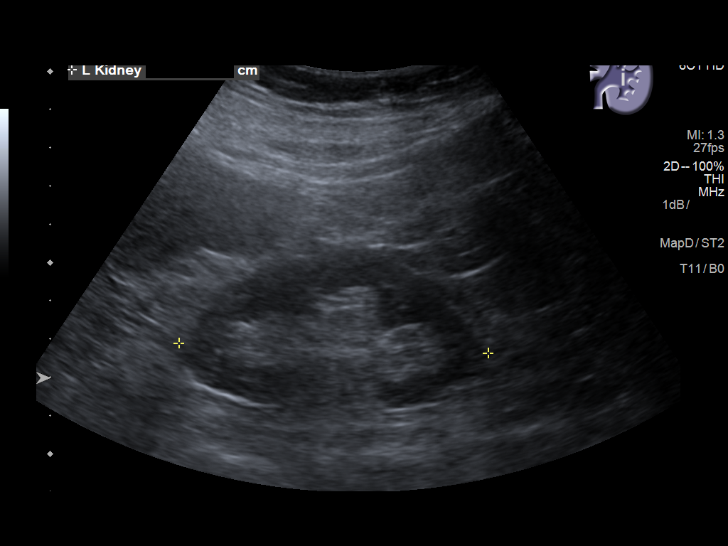
[im 23/37]
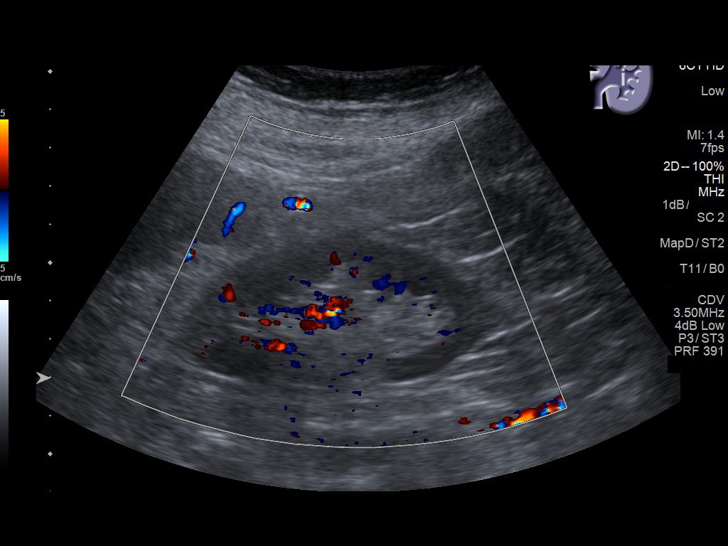
[im 25/37]
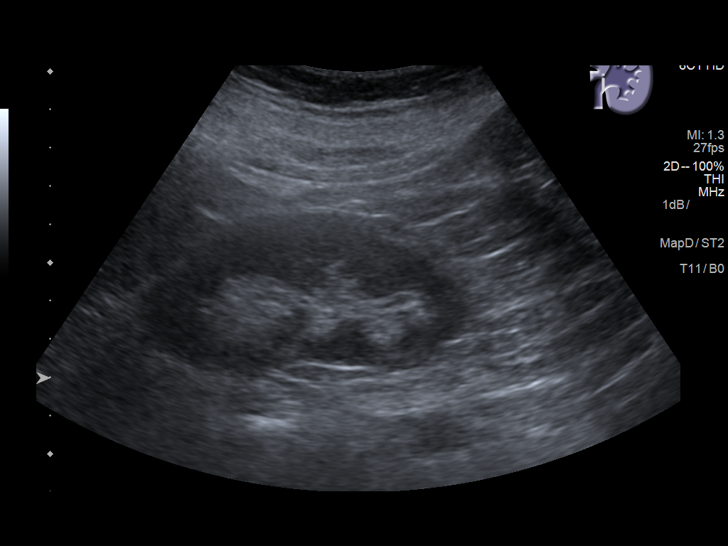
[im 28/37]
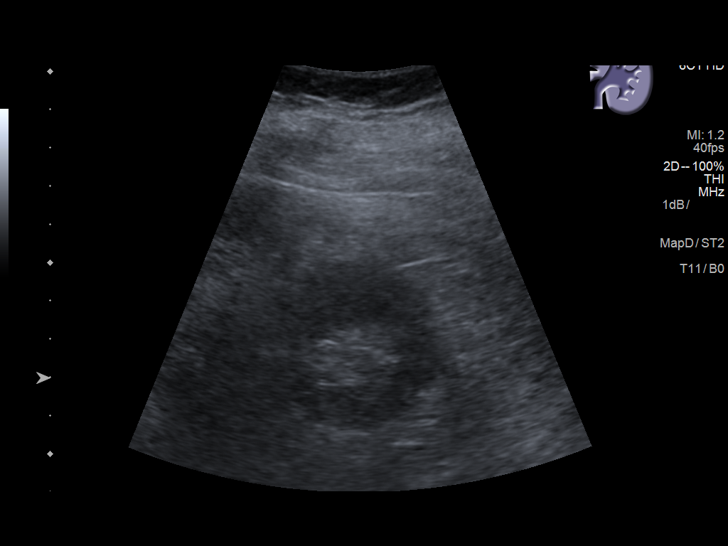
[im 31/37]
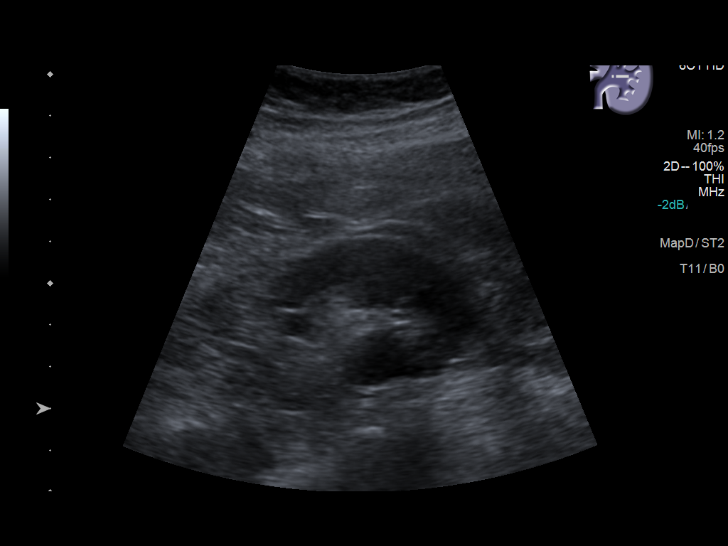
[im 34/37]
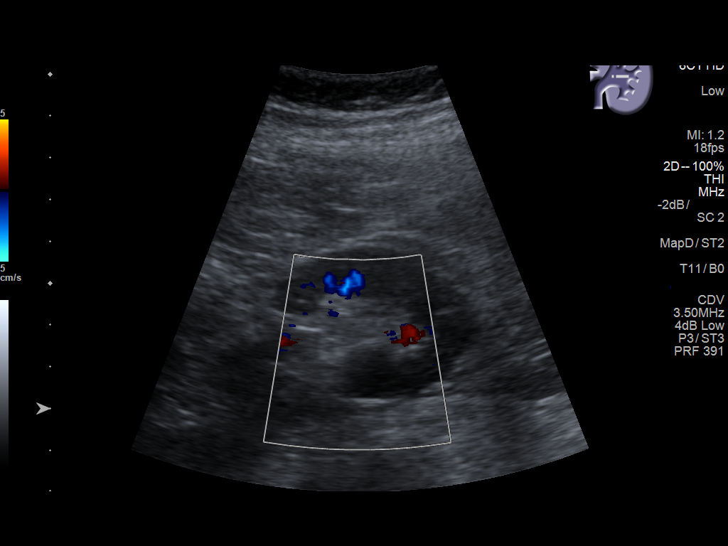
[im 37/37]
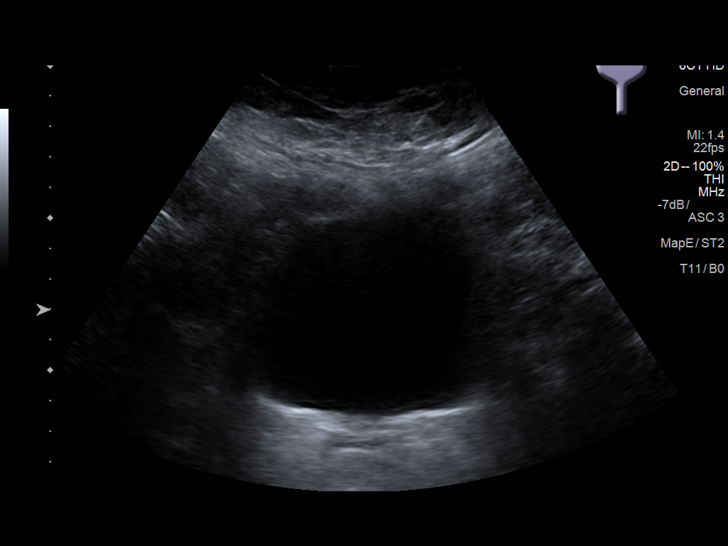

[14 of 25 positions shown; findings below may reference images not displayed]

FINDINGS: Right Kidney:

Length: 8.9 cm. Echogenicity within normal limits. No mass or
hydronephrosis visualized.

Left Kidney:

Length: 8.7 cm. Echogenicity within normal limits. LEFT renal cyst
measures 1.5 cm. No suspicious mass or hydronephrosis visualized.

Bladder:

Appears normal for degree of bladder distention.
IMPRESSION: No acute/significant findings.

## 2019-06-09 ENCOUNTER — Other Ambulatory Visit: Payer: Self-pay

## 2019-06-10 ENCOUNTER — Inpatient Hospital Stay: Payer: Medicare PPO | Attending: Hematology and Oncology

## 2019-06-10 DIAGNOSIS — I129 Hypertensive chronic kidney disease with stage 1 through stage 4 chronic kidney disease, or unspecified chronic kidney disease: Secondary | ICD-10-CM | POA: Diagnosis not present

## 2019-06-10 DIAGNOSIS — D631 Anemia in chronic kidney disease: Secondary | ICD-10-CM | POA: Insufficient documentation

## 2019-06-10 DIAGNOSIS — D509 Iron deficiency anemia, unspecified: Secondary | ICD-10-CM | POA: Insufficient documentation

## 2019-06-10 DIAGNOSIS — E538 Deficiency of other specified B group vitamins: Secondary | ICD-10-CM | POA: Diagnosis not present

## 2019-06-10 DIAGNOSIS — N183 Chronic kidney disease, stage 3 unspecified: Secondary | ICD-10-CM | POA: Diagnosis not present

## 2019-06-10 LAB — IRON AND TIBC
Iron: 58 ug/dL (ref 28–170)
Saturation Ratios: 15 % (ref 10.4–31.8)
TIBC: 391 ug/dL (ref 250–450)
UIBC: 333 ug/dL

## 2019-06-10 LAB — CBC WITH DIFFERENTIAL/PLATELET
Abs Immature Granulocytes: 0.02 10*3/uL (ref 0.00–0.07)
Basophils Absolute: 0.1 10*3/uL (ref 0.0–0.1)
Basophils Relative: 1 %
Eosinophils Absolute: 0.2 10*3/uL (ref 0.0–0.5)
Eosinophils Relative: 3 %
HCT: 32.6 % — ABNORMAL LOW (ref 36.0–46.0)
Hemoglobin: 10.9 g/dL — ABNORMAL LOW (ref 12.0–15.0)
Immature Granulocytes: 0 %
Lymphocytes Relative: 47 %
Lymphs Abs: 3 10*3/uL (ref 0.7–4.0)
MCH: 29.2 pg (ref 26.0–34.0)
MCHC: 33.4 g/dL (ref 30.0–36.0)
MCV: 87.4 fL (ref 80.0–100.0)
Monocytes Absolute: 0.9 10*3/uL (ref 0.1–1.0)
Monocytes Relative: 13 %
Neutro Abs: 2.3 10*3/uL (ref 1.7–7.7)
Neutrophils Relative %: 36 %
Platelets: 259 10*3/uL (ref 150–400)
RBC: 3.73 MIL/uL — ABNORMAL LOW (ref 3.87–5.11)
RDW: 15.8 % — ABNORMAL HIGH (ref 11.5–15.5)
WBC: 6.4 10*3/uL (ref 4.0–10.5)
nRBC: 0 % (ref 0.0–0.2)

## 2019-06-10 LAB — FERRITIN: Ferritin: 22 ng/mL (ref 11–307)

## 2019-06-11 ENCOUNTER — Ambulatory Visit: Payer: Medicare Other | Admitting: Hematology and Oncology

## 2019-06-11 ENCOUNTER — Ambulatory Visit: Payer: Medicare Other

## 2019-06-12 ENCOUNTER — Other Ambulatory Visit: Payer: Self-pay | Admitting: Hematology and Oncology

## 2019-06-15 NOTE — Progress Notes (Signed)
[facility]  [address] [place], [place] [address] Phone: [phone]  Fax: [phone]   Clinic Day:  06/18/2019  Referring physician: Ezequiel Kayser, MD  Chief Complaint: Peggy Phillips is a 79 y.o. female with anemia in chronic kidney disease who is seen for 2 month assessment and continuation of Retacrit.   HPI: The patient was last seen in the hematology clinic on 04/09/2019. At that time, she was doing well.  She had felt much better since discontinuation of oral iron and initiation of IV iron.  Weight was stable.   CBC followed: 04/08/2019: Hematocrit 32.5, hemoglobin 10.7, MCV 84.6, platelets 319,000, WBC 6800. Ferritin 106. 04/22/2019: Hematocrit 35.2, hemoglobin 11.5, MCV 86.5, platelets 312,000, WBC 6600. Ferritin 51, iron sat 17% and TIBC 425 06/10/2019: Hematocrit 32.6, hemoglobin 10.9, MCV 87.4, platelets 259,000, WBC 6400. Ferritin 22, iron sat 15% and TIBC 391.  During the interim, she has felt "marvelous".  She notes a little more energy.  He denies any bleeding.  She states that oral iron makes her sick.  She is not interested in a colonoscopy.  She was previously seen by Dr. Gustavo Lah.  She does not like the colonoscopy prep.  She states that her neck bothers her a lot.  She is unsure if it is related to her fibromyalgia.  Followed by [facility] for her chronic back and hip pain.   Past Medical History:  Diagnosis Date  . High cholesterol   . Hypertension   . Thyroid disease     Past Surgical History:  Procedure Laterality Date  . ABDOMINAL HYSTERECTOMY    . APPENDECTOMY    . BREAST BIOPSY Left 07/27/2004   neg  . BREAST SURGERY    . GALLBLADDER SURGERY    . herniated disc      Family History  Problem Relation Age of Onset  . Breast cancer Neg Hx     Social History:  reports that she has never smoked. She has never used smokeless tobacco. She reports that she does not use drugs. No history on file for  alcohol. Granddaughter live with her. She was a retired Clinical biochemist.She lives in [place].  The patient is alone today.  Allergies:  Allergies  Allergen Reactions  . Atorvastatin Other (See Comments)    Elevated LFT's  . Codeine Sulfate Other (See Comments)  . Meloxicam Diarrhea  . Other Hives    sporonox   . Simvastatin     Other reaction(s): Liver Disorder Elevated LFT's  . Sulfa Antibiotics Other (See Comments)    Current Medications: Current Outpatient Medications  Medication Sig Dispense Refill  . allopurinol (ZYLOPRIM) 300 MG tablet Take 300 mg by mouth daily.     Marland Kitchen ammonium lactate (LAC-HYDRIN) 12 % lotion Apply 1 application topically as needed.     . chlorthalidone (HYGROTON) 25 MG tablet Take 25 mg by mouth daily.    . Cyanocobalamin (VITAMIN B 12) 500 MCG TABS Take 500 mcg by mouth daily.     . diclofenac sodium (VOLTAREN) 1 % GEL Apply 2 g topically as needed.     Mariane Baumgarten Sodium 100 MG capsule Take 200 mg by mouth daily.     Marland Kitchen FLUoxetine (PROZAC) 20 MG capsule Take 20 mg by mouth daily.    . fluticasone (FLONASE) 50 MCG/ACT nasal spray Place 2 sprays into both nostrils daily.     . furosemide (LASIX) 20 MG tablet Take 20 mg by mouth every other  day.     Marland Kitchen HYDROmorphone (DILAUDID) 4 MG tablet Take 4 mg by mouth every 6 (six) hours as needed.     Marland Kitchen levothyroxine (SYNTHROID, LEVOTHROID) 112 MCG tablet Take 112 mcg by mouth daily before breakfast.    . lisinopril (ZESTRIL) 40 MG tablet Take 40 mg by mouth daily.     . metoprolol succinate (TOPROL-XL) 25 MG 24 hr tablet Take 25 mg by mouth daily.     Marland Kitchen omeprazole (PRILOSEC) 20 MG capsule Take 20 mg by mouth daily.     . polycarbophil (FIBERCON) 625 MG tablet Take 625 mg by mouth daily.     Marland Kitchen spironolactone (ALDACTONE) 25 MG tablet Take 25 mg by mouth every other day.     . traZODone (DESYREL) 50 MG tablet Take 50 mg by mouth at bedtime as needed.     . Vitamin D, Ergocalciferol, (DRISDOL) 50000  UNITS CAPS capsule Take 50,000 Units by mouth every 7 (seven) days.     . Wheat Dextrin (BENEFIBER PO) Take by mouth daily.     Marland Kitchen ZETIA 10 MG tablet Take 10 mg by mouth daily.     . naloxone (NARCAN) 4 MG/0.1ML LIQD nasal spray kit      No current facility-administered medications for this visit.    Review of Systems  Constitutional: Negative for chills, diaphoresis, fever, malaise/fatigue and weight loss.       Feels "marvelous".  Notes more energy.  HENT: Negative.  Negative for congestion, ear pain, nosebleeds, sinus pain and sore throat.   Eyes: Negative.  Negative for blurred vision, double vision and photophobia.  Respiratory: Negative.  Negative for cough, hemoptysis, sputum production and shortness of breath.   Cardiovascular: Negative.  Negative for chest pain, palpitations and orthopnea.  Gastrointestinal: Negative.  Negative for abdominal pain, blood in stool, constipation, diarrhea, melena, nausea and vomiting.  Genitourinary: Negative.  Negative for dysuria, frequency, hematuria and urgency.  Musculoskeletal: Positive for back pain, joint pain (hip) and neck pain.  Skin: Negative.  Negative for itching and rash.  Neurological: Negative.  Negative for dizziness, tingling, tremors, sensory change, speech change, focal weakness, weakness and headaches.  Endo/Heme/Allergies: Negative.  Negative for environmental allergies. Does not bruise/bleed easily.  Psychiatric/Behavioral: Negative.  Negative for depression and memory loss. The patient is not nervous/anxious and does not have insomnia.    Performance status (ECOG): 0-1  Vitals Blood pressure 110/60, pulse 61, temperature (!) 97.3 F (36.3 C), temperature source Tympanic, resp. rate 16, weight 188 lb 11.4 oz (85.6 kg), SpO2 100 %.   Physical Exam  Constitutional: She is oriented to person, place, and time. She appears well-nourished. No distress.  HENT:  Head: Normocephalic and atraumatic.  Mouth/Throat: Oropharynx is  clear and moist. No oropharyngeal exudate.  Short styled blonde hair.  Mask.  Eyes: Pupils are equal, round, and reactive to light. Conjunctivae and EOM are normal. No scleral icterus.  Blue eyes.  Neck: No JVD present.  Cardiovascular: Normal rate and normal heart sounds. Exam reveals no gallop.  No murmur heard. Pulmonary/Chest: Effort normal and breath sounds normal. No respiratory distress. She has no wheezes. She has no rales.  Abdominal: Soft. Bowel sounds are normal. She exhibits no distension and no mass. There is no abdominal tenderness. There is no rebound and no guarding.  Musculoskeletal:        General: No edema. Normal range of motion.     Cervical back: Neck supple.  Lymphadenopathy:  Head (right side): No preauricular, no posterior auricular and no occipital adenopathy present.       Head (left side): No preauricular, no posterior auricular and no occipital adenopathy present.    She has no cervical adenopathy.    She has no axillary adenopathy.       Right: No inguinal and no supraclavicular adenopathy present.       Left: No inguinal and no supraclavicular adenopathy present.  Neurological: She is alert and oriented to person, place, and time.  Skin: Skin is warm and dry. No rash noted. She is not diaphoretic. No erythema.  Psychiatric: She has a normal mood and affect. Her behavior is normal. Judgment and thought content normal.  Nursing note and vitals reviewed.   No visits with results within 3 Day(s) from this visit.  Latest known visit with results is:  Appointment on 06/10/2019  Component Date Value Ref Range Status  . Iron 06/10/2019 58  28 - 170 ug/dL Final  . TIBC 06/10/2019 391  250 - 450 ug/dL Final  . Saturation Ratios 06/10/2019 15  10.4 - 31.8 % Final  . UIBC 06/10/2019 333  ug/dL Final   Performed at [facility], [address], [place], [place] [address]  . Ferritin 06/10/2019 22  11 - 307 ng/mL Final   Performed at [facility], [address] [place], [place] [address]  . WBC 06/10/2019 6.4  4.0 - 10.5 K/uL Final  . RBC 06/10/2019 3.73* 3.87 - 5.11 MIL/uL Final  . Hemoglobin 06/10/2019 10.9* 12.0 - 15.0 g/dL Final  . HCT 06/10/2019 32.6* 36.0 - 46.0 % Final  . MCV 06/10/2019 87.4  80.0 - 100.0 fL Final  . MCH 06/10/2019 29.2  26.0 - 34.0 pg Final  . MCHC 06/10/2019 33.4  30.0 - 36.0 g/dL Final  . RDW 06/10/2019 15.8* 11.5 - 15.5 % Final  . Platelets 06/10/2019 259  150 - 400 K/uL Final  . nRBC 06/10/2019 0.0  0.0 - 0.2 % Final  . Neutrophils Relative % 06/10/2019 36  % Final  . Neutro Abs 06/10/2019 2.3  1.7 - 7.7 K/uL Final  . Lymphocytes Relative 06/10/2019 47  % Final  . Lymphs Abs 06/10/2019 3.0  0.7 - 4.0 K/uL Final  . Monocytes Relative 06/10/2019 13  % Final  . Monocytes Absolute 06/10/2019 0.9  0.1 - 1.0 K/uL Final  . Eosinophils Relative 06/10/2019 3  % Final  . Eosinophils Absolute 06/10/2019 0.2  0.0 - 0.5 K/uL Final  . Basophils Relative 06/10/2019 1  % Final  . Basophils Absolute 06/10/2019 0.1  0.0 - 0.1 K/uL Final  . Immature Granulocytes 06/10/2019 0  % Final  . Abs Immature Granulocytes 06/10/2019 0.02  0.00 - 0.07 K/uL Final   Performed at [facility], [address], [place],  [address]    Assessment:  Peggy Phillips is a 79 y.o. female  with anemia of chronic renal disease. Creatinine was 1.9 on 09/03/2020and 1.67 on 02/13/2019.SPEP and random UPEP on 09/06/2017 revealed no monoclonal protein. Renalultrasoundon 10/11/2017 revealedno acute findings.   Labs on 10/08/2020revealed a hematocrit 29.6, hemoglobin 9.9, MCV 85.8, platelets 225,000,WBC4700.  Work up on 03/14/2019 revealed a hematocrit 31.8, hemoglobin 10.4, MCV 85.3, platelets 310,000, WBC 7,400. Ferritin was 11 with an iron saturation of 7% and a TIBC of 461. B12 and folate were normal.  Retic was 1.2%. Sed rate was 42.  She has iron deficiency.Dietis poor. Last  colonoscopywas 7 years  ago. She denies any melena, hematochezia, hematuria or vaginal bleeding.  She is intolerant of oral iron.  She received Venofer on 04/02/2019.  She received premedications (Decadron 4 mg, ondansetron 4 mg, and Benadryl 25 mg) secondary to her sensitivities with medications.  Ferritinhas been followed: 26 on 07/17/2016, 29 on 03/16/2015, 19 on 10/15/2017, 23 on 02/13/2019, 11 on 03/14/2019, 15 on 03/31/2019, 106 on 04/08/2019, 51 on 04/22/2019 and 22 on 06/10/2019. Iron saturation was 7% with a TIBC of 461 on 03/14/2019.  She has B12 deficiency. She has been on oral B44fr many years. B12 was 762 on 07/17/2017 and 1137 on 03/14/2019. Folate was 9.7 on 03/14/2019  Symptomatically, she feels good.  She denies any bleeding.  Plan: 1.   Review labs from 04/22/2019 and 06/10/2019. 2. Iron deficiency Hematocrit 32.6.  Hemoglobin 10.9.  MCV 87.4 on 06/10/2019.                         Ferritin was 22 (low) on 06/10/2019. Patient is intolerant of oral iron.             Etiology appears secondary to diet.             Last colonoscopy 7 years ago.             Patient denies any bleeding. Venofer today with premeds (Decadron, Benadryl, ondansetron).  Encourage patient to follow-up with GI (Dr TAlice Reichert.   Note to Dr TAlice Reichert 3.B12 deficiency Patient on oral B12. B12 was 1137 and and folate 9.7 on 03/14/2019. 4.   Anemia of chronic renal disease Creatinine was 1.67 on 02/13/2019. Begin Retacrit when iron stores are normal and hemoglobin <10. Continue to monitor. 5.RTC in 2 months for labs (CBC, ferritin). 6.   RTC in 4 months for MD assessment, labs (CBC with diff, ferritin-day before) and +/- Venofer and +/- Retacrit.  I discussed the assessment and treatment plan with the patient.  The patient was provided an opportunity to ask questions  and all were answered.  The patient agreed with the plan and demonstrated an understanding of the instructions.  The patient was advised to call back if the symptoms worsen or if the condition fails to improve as anticipated.   MLequita Asal MD, PhD    06/18/2019, 5:15 PM

## 2019-06-18 ENCOUNTER — Encounter: Payer: Self-pay | Admitting: Hematology and Oncology

## 2019-06-18 ENCOUNTER — Inpatient Hospital Stay (HOSPITAL_BASED_OUTPATIENT_CLINIC_OR_DEPARTMENT_OTHER): Payer: Medicare PPO | Admitting: Hematology and Oncology

## 2019-06-18 ENCOUNTER — Other Ambulatory Visit: Payer: Self-pay

## 2019-06-18 ENCOUNTER — Inpatient Hospital Stay: Payer: Medicare PPO

## 2019-06-18 VITALS — BP 110/60 | HR 61 | Temp 97.3°F | Resp 16 | Wt 188.7 lb

## 2019-06-18 VITALS — BP 117/73 | HR 49 | Resp 16

## 2019-06-18 DIAGNOSIS — E538 Deficiency of other specified B group vitamins: Secondary | ICD-10-CM | POA: Diagnosis not present

## 2019-06-18 DIAGNOSIS — N183 Chronic kidney disease, stage 3 unspecified: Secondary | ICD-10-CM | POA: Diagnosis not present

## 2019-06-18 DIAGNOSIS — D509 Iron deficiency anemia, unspecified: Secondary | ICD-10-CM

## 2019-06-18 DIAGNOSIS — D649 Anemia, unspecified: Secondary | ICD-10-CM

## 2019-06-18 MED ORDER — DEXAMETHASONE SODIUM PHOSPHATE 10 MG/ML IJ SOLN
4.0000 mg | Freq: Once | INTRAMUSCULAR | Status: AC
  Administered 2019-06-18: 4 mg via INTRAVENOUS
  Filled 2019-06-18: qty 1

## 2019-06-18 MED ORDER — SODIUM CHLORIDE 0.9 % IV SOLN
Freq: Once | INTRAVENOUS | Status: AC
  Filled 2019-06-18: qty 250

## 2019-06-18 MED ORDER — SODIUM CHLORIDE 0.9 % IV SOLN: 4.0000 mg | Freq: Once | INTRAVENOUS | Status: DC

## 2019-06-18 MED ORDER — ONDANSETRON HCL 4 MG/2ML IJ SOLN
4.0000 mg | Freq: Once | INTRAMUSCULAR | Status: AC
  Administered 2019-06-18: 4 mg via INTRAVENOUS
  Filled 2019-06-18: qty 2

## 2019-06-18 MED ORDER — SODIUM CHLORIDE 0.9 % IV SOLN: 200.0000 mg | Freq: Once | INTRAVENOUS | Status: DC

## 2019-06-18 MED ORDER — DIPHENHYDRAMINE HCL 50 MG/ML IJ SOLN
25.0000 mg | Freq: Once | INTRAMUSCULAR | Status: AC
  Administered 2019-06-18: 25 mg via INTRAVENOUS
  Filled 2019-06-18: qty 1

## 2019-06-18 MED ORDER — IRON SUCROSE 20 MG/ML IV SOLN
200.0000 mg | Freq: Once | INTRAVENOUS | Status: AC
  Administered 2019-06-18: 200 mg via INTRAVENOUS

## 2019-06-18 NOTE — Patient Instructions (Signed)

## 2019-06-18 NOTE — Progress Notes (Signed)
Patient here for follow up. Denies any concerns.  

## 2019-07-16 ENCOUNTER — Inpatient Hospital Stay: Payer: Medicare PPO

## 2019-07-17 ENCOUNTER — Inpatient Hospital Stay: Payer: Medicare PPO | Attending: Nurse Practitioner

## 2019-07-17 ENCOUNTER — Other Ambulatory Visit: Payer: Self-pay

## 2019-07-17 DIAGNOSIS — E538 Deficiency of other specified B group vitamins: Secondary | ICD-10-CM | POA: Insufficient documentation

## 2019-07-17 DIAGNOSIS — D509 Iron deficiency anemia, unspecified: Secondary | ICD-10-CM | POA: Diagnosis not present

## 2019-07-17 DIAGNOSIS — D631 Anemia in chronic kidney disease: Secondary | ICD-10-CM | POA: Diagnosis not present

## 2019-07-17 DIAGNOSIS — N183 Chronic kidney disease, stage 3 unspecified: Secondary | ICD-10-CM | POA: Insufficient documentation

## 2019-07-17 DIAGNOSIS — I129 Hypertensive chronic kidney disease with stage 1 through stage 4 chronic kidney disease, or unspecified chronic kidney disease: Secondary | ICD-10-CM | POA: Insufficient documentation

## 2019-07-17 LAB — FERRITIN: Ferritin: 55 ng/mL (ref 11–307)

## 2019-07-17 LAB — CBC
HCT: 37.9 % (ref 36.0–46.0)
Hemoglobin: 12.9 g/dL (ref 12.0–15.0)
MCH: 30.1 pg (ref 26.0–34.0)
MCHC: 34 g/dL (ref 30.0–36.0)
MCV: 88.6 fL (ref 80.0–100.0)
Platelets: 315 10*3/uL (ref 150–400)
RBC: 4.28 MIL/uL (ref 3.87–5.11)
RDW: 14.7 % (ref 11.5–15.5)
WBC: 7.8 10*3/uL (ref 4.0–10.5)
nRBC: 0 % (ref 0.0–0.2)

## 2019-08-07 ENCOUNTER — Other Ambulatory Visit: Payer: Self-pay

## 2019-08-07 DIAGNOSIS — D649 Anemia, unspecified: Secondary | ICD-10-CM

## 2019-08-07 DIAGNOSIS — D509 Iron deficiency anemia, unspecified: Secondary | ICD-10-CM

## 2019-08-13 ENCOUNTER — Other Ambulatory Visit: Payer: Self-pay

## 2019-08-13 ENCOUNTER — Inpatient Hospital Stay: Payer: Medicare PPO | Attending: Hematology and Oncology

## 2019-08-13 DIAGNOSIS — N183 Chronic kidney disease, stage 3 unspecified: Secondary | ICD-10-CM | POA: Diagnosis not present

## 2019-08-13 DIAGNOSIS — E538 Deficiency of other specified B group vitamins: Secondary | ICD-10-CM | POA: Diagnosis not present

## 2019-08-13 DIAGNOSIS — I129 Hypertensive chronic kidney disease with stage 1 through stage 4 chronic kidney disease, or unspecified chronic kidney disease: Secondary | ICD-10-CM | POA: Diagnosis present

## 2019-08-13 DIAGNOSIS — D649 Anemia, unspecified: Secondary | ICD-10-CM

## 2019-08-13 DIAGNOSIS — D509 Iron deficiency anemia, unspecified: Secondary | ICD-10-CM

## 2019-08-13 DIAGNOSIS — D631 Anemia in chronic kidney disease: Secondary | ICD-10-CM | POA: Insufficient documentation

## 2019-08-13 LAB — CBC
HCT: 34.3 % — ABNORMAL LOW (ref 36.0–46.0)
Hemoglobin: 11.8 g/dL — ABNORMAL LOW (ref 12.0–15.0)
MCH: 30.6 pg (ref 26.0–34.0)
MCHC: 34.4 g/dL (ref 30.0–36.0)
MCV: 88.9 fL (ref 80.0–100.0)
Platelets: 263 10*3/uL (ref 150–400)
RBC: 3.86 MIL/uL — ABNORMAL LOW (ref 3.87–5.11)
RDW: 14 % (ref 11.5–15.5)
WBC: 5.6 10*3/uL (ref 4.0–10.5)
nRBC: 0 % (ref 0.0–0.2)

## 2019-08-13 LAB — FERRITIN: Ferritin: 39 ng/mL (ref 11–307)

## 2019-08-14 ENCOUNTER — Telehealth: Payer: Self-pay

## 2019-08-14 NOTE — Telephone Encounter (Signed)
Spoke with the patient to inform her that per Dr Merlene Pulling he ferritin has drifted down slightly from 55 to 39 and Dr Merlene Pulling consider venofer when ferritin is < 30. The patient reports she is not having any symptoms today.The patient was understanding and agreeable.

## 2019-09-05 ENCOUNTER — Inpatient Hospital Stay: Payer: Medicare PPO

## 2019-09-11 ENCOUNTER — Other Ambulatory Visit: Payer: Self-pay

## 2019-09-11 ENCOUNTER — Inpatient Hospital Stay: Payer: Medicare PPO | Attending: Hematology and Oncology

## 2019-09-11 DIAGNOSIS — D509 Iron deficiency anemia, unspecified: Secondary | ICD-10-CM | POA: Insufficient documentation

## 2019-09-11 DIAGNOSIS — E538 Deficiency of other specified B group vitamins: Secondary | ICD-10-CM | POA: Diagnosis not present

## 2019-09-11 DIAGNOSIS — D649 Anemia, unspecified: Secondary | ICD-10-CM

## 2019-09-11 LAB — CBC
HCT: 31.9 % — ABNORMAL LOW (ref 36.0–46.0)
Hemoglobin: 10.9 g/dL — ABNORMAL LOW (ref 12.0–15.0)
MCH: 30.9 pg (ref 26.0–34.0)
MCHC: 34.2 g/dL (ref 30.0–36.0)
MCV: 90.4 fL (ref 80.0–100.0)
Platelets: 264 10*3/uL (ref 150–400)
RBC: 3.53 MIL/uL — ABNORMAL LOW (ref 3.87–5.11)
RDW: 14.5 % (ref 11.5–15.5)
WBC: 6.6 10*3/uL (ref 4.0–10.5)
nRBC: 0 % (ref 0.0–0.2)

## 2019-09-11 LAB — FERRITIN: Ferritin: 36 ng/mL (ref 11–307)

## 2019-09-12 ENCOUNTER — Inpatient Hospital Stay: Payer: Medicare PPO

## 2019-09-12 VITALS — BP 140/65 | HR 57 | Temp 97.5°F | Resp 18

## 2019-09-12 DIAGNOSIS — D509 Iron deficiency anemia, unspecified: Secondary | ICD-10-CM | POA: Diagnosis not present

## 2019-09-12 DIAGNOSIS — D649 Anemia, unspecified: Secondary | ICD-10-CM

## 2019-09-12 MED ORDER — SODIUM CHLORIDE 0.9 % IV SOLN: 4.0000 mg | Freq: Once | INTRAVENOUS | Status: DC

## 2019-09-12 MED ORDER — SODIUM CHLORIDE 0.9 % IV SOLN: 200.0000 mg | Freq: Once | INTRAVENOUS | Status: DC

## 2019-09-12 MED ORDER — IRON SUCROSE 20 MG/ML IV SOLN
200.0000 mg | Freq: Once | INTRAVENOUS | Status: AC
  Administered 2019-09-12: 200 mg via INTRAVENOUS

## 2019-09-12 MED ORDER — DEXAMETHASONE SODIUM PHOSPHATE 10 MG/ML IJ SOLN
4.0000 mg | Freq: Once | INTRAMUSCULAR | Status: AC
  Administered 2019-09-12: 4 mg via INTRAVENOUS

## 2019-09-12 MED ORDER — SODIUM CHLORIDE 0.9 % IV SOLN
Freq: Once | INTRAVENOUS | Status: AC
  Filled 2019-09-12: qty 250

## 2019-09-12 MED ORDER — DIPHENHYDRAMINE HCL 50 MG/ML IJ SOLN
25.0000 mg | Freq: Once | INTRAMUSCULAR | Status: AC
  Administered 2019-09-12: 25 mg via INTRAVENOUS

## 2019-09-12 MED ORDER — ONDANSETRON HCL 4 MG/2ML IJ SOLN
4.0000 mg | Freq: Once | INTRAMUSCULAR | Status: AC
  Administered 2019-09-12: 4 mg via INTRAVENOUS

## 2019-09-12 NOTE — Progress Notes (Signed)
Patient received venofer infusion today. Pre medications given as ordered. Tolerated well. Patient having hip surgery next week. She will call our office once she is out and about post rehab to reschedule labs and iron infusions.

## 2019-09-12 NOTE — Patient Instructions (Signed)

## 2019-10-09 ENCOUNTER — Other Ambulatory Visit: Payer: Self-pay | Admitting: Licensed Clinical Social Worker

## 2019-10-09 DIAGNOSIS — D509 Iron deficiency anemia, unspecified: Secondary | ICD-10-CM

## 2019-10-14 ENCOUNTER — Other Ambulatory Visit: Payer: Medicare PPO

## 2019-10-14 ENCOUNTER — Inpatient Hospital Stay: Payer: Medicare PPO

## 2019-10-15 ENCOUNTER — Inpatient Hospital Stay: Payer: Medicare PPO

## 2019-10-15 ENCOUNTER — Inpatient Hospital Stay: Payer: Medicare PPO | Admitting: Hematology and Oncology

## 2019-10-15 ENCOUNTER — Other Ambulatory Visit: Payer: Self-pay | Admitting: Hematology and Oncology

## 2019-10-15 DIAGNOSIS — N183 Chronic kidney disease, stage 3 unspecified: Secondary | ICD-10-CM

## 2019-10-15 DIAGNOSIS — D649 Anemia, unspecified: Secondary | ICD-10-CM

## 2019-10-20 ENCOUNTER — Inpatient Hospital Stay: Payer: Medicare PPO | Attending: Hematology and Oncology

## 2019-10-20 ENCOUNTER — Other Ambulatory Visit: Payer: Self-pay

## 2019-10-20 DIAGNOSIS — N183 Chronic kidney disease, stage 3 unspecified: Secondary | ICD-10-CM | POA: Diagnosis not present

## 2019-10-20 DIAGNOSIS — R634 Abnormal weight loss: Secondary | ICD-10-CM | POA: Diagnosis not present

## 2019-10-20 DIAGNOSIS — E538 Deficiency of other specified B group vitamins: Secondary | ICD-10-CM | POA: Diagnosis not present

## 2019-10-20 DIAGNOSIS — Z791 Long term (current) use of non-steroidal anti-inflammatories (NSAID): Secondary | ICD-10-CM | POA: Diagnosis not present

## 2019-10-20 DIAGNOSIS — Z79899 Other long term (current) drug therapy: Secondary | ICD-10-CM | POA: Insufficient documentation

## 2019-10-20 DIAGNOSIS — E079 Disorder of thyroid, unspecified: Secondary | ICD-10-CM | POA: Diagnosis not present

## 2019-10-20 DIAGNOSIS — I129 Hypertensive chronic kidney disease with stage 1 through stage 4 chronic kidney disease, or unspecified chronic kidney disease: Secondary | ICD-10-CM | POA: Insufficient documentation

## 2019-10-20 DIAGNOSIS — D509 Iron deficiency anemia, unspecified: Secondary | ICD-10-CM | POA: Insufficient documentation

## 2019-10-20 DIAGNOSIS — M549 Dorsalgia, unspecified: Secondary | ICD-10-CM | POA: Insufficient documentation

## 2019-10-20 DIAGNOSIS — E7800 Pure hypercholesterolemia, unspecified: Secondary | ICD-10-CM | POA: Insufficient documentation

## 2019-10-20 DIAGNOSIS — D649 Anemia, unspecified: Secondary | ICD-10-CM

## 2019-10-20 DIAGNOSIS — D631 Anemia in chronic kidney disease: Secondary | ICD-10-CM | POA: Diagnosis not present

## 2019-10-20 DIAGNOSIS — Z96641 Presence of right artificial hip joint: Secondary | ICD-10-CM | POA: Diagnosis not present

## 2019-10-20 DIAGNOSIS — R600 Localized edema: Secondary | ICD-10-CM | POA: Insufficient documentation

## 2019-10-20 LAB — BASIC METABOLIC PANEL
Anion gap: 9 (ref 5–15)
BUN: 14 mg/dL (ref 8–23)
CO2: 26 mmol/L (ref 22–32)
Calcium: 9.1 mg/dL (ref 8.9–10.3)
Chloride: 97 mmol/L — ABNORMAL LOW (ref 98–111)
Creatinine, Ser: 1.11 mg/dL — ABNORMAL HIGH (ref 0.44–1.00)
GFR calc Af Amer: 55 mL/min — ABNORMAL LOW (ref >60)
GFR calc non Af Amer: 48 mL/min — ABNORMAL LOW (ref >60)
Glucose, Bld: 103 mg/dL — ABNORMAL HIGH (ref 70–99)
Potassium: 3.9 mmol/L (ref 3.5–5.1)
Sodium: 132 mmol/L — ABNORMAL LOW (ref 135–145)

## 2019-10-20 LAB — CBC WITH DIFFERENTIAL/PLATELET
Abs Immature Granulocytes: 0.03 10*3/uL (ref 0.00–0.07)
Basophils Absolute: 0.1 10*3/uL (ref 0.0–0.1)
Basophils Relative: 1 %
Eosinophils Absolute: 0.3 10*3/uL (ref 0.0–0.5)
Eosinophils Relative: 4 %
HCT: 31.9 % — ABNORMAL LOW (ref 36.0–46.0)
Hemoglobin: 10.5 g/dL — ABNORMAL LOW (ref 12.0–15.0)
Immature Granulocytes: 0 %
Lymphocytes Relative: 34 %
Lymphs Abs: 2.8 10*3/uL (ref 0.7–4.0)
MCH: 30.3 pg (ref 26.0–34.0)
MCHC: 32.9 g/dL (ref 30.0–36.0)
MCV: 91.9 fL (ref 80.0–100.0)
Monocytes Absolute: 1 10*3/uL (ref 0.1–1.0)
Monocytes Relative: 12 %
Neutro Abs: 4 10*3/uL (ref 1.7–7.7)
Neutrophils Relative %: 49 %
Platelets: 414 10*3/uL — ABNORMAL HIGH (ref 150–400)
RBC: 3.47 MIL/uL — ABNORMAL LOW (ref 3.87–5.11)
RDW: 14.4 % (ref 11.5–15.5)
WBC: 8.2 10*3/uL (ref 4.0–10.5)
nRBC: 0 % (ref 0.0–0.2)

## 2019-10-20 LAB — SAMPLE TO BLOOD BANK

## 2019-10-20 LAB — RETICULOCYTES
Immature Retic Fract: 11.9 % (ref 2.3–15.9)
RBC.: 3.44 MIL/uL — ABNORMAL LOW (ref 3.87–5.11)
Retic Count, Absolute: 100.4 10*3/uL (ref 19.0–186.0)
Retic Ct Pct: 2.9 % (ref 0.4–3.1)

## 2019-10-20 LAB — SEDIMENTATION RATE: Sed Rate: 68 mm/hr — ABNORMAL HIGH (ref 0–30)

## 2019-10-21 LAB — IRON AND TIBC
Iron: 27 ug/dL — ABNORMAL LOW (ref 28–170)
Saturation Ratios: 8 % — ABNORMAL LOW (ref 10.4–31.8)
TIBC: 326 ug/dL (ref 250–450)
UIBC: 299 ug/dL

## 2019-10-21 LAB — PATHOLOGIST SMEAR REVIEW

## 2019-10-21 LAB — DAT, POLYSPECIFIC AHG (ARMC ONLY): Polyspecific AHG test: NEGATIVE

## 2019-10-21 LAB — FERRITIN: Ferritin: 82 ng/mL (ref 11–307)

## 2019-10-22 NOTE — Progress Notes (Signed)
[facility]  [address] [place], [place] [address] Phone: [phone]  Fax: [phone]   Clinic Day:  10/24/2019  Referring physician: Ezequiel Kayser, MD  Chief Complaint: Peggy Phillips is a 79 y.o. female with iron deficiency anemia and anemia of chronic kidney disease who is seen for a 4 month assessment.   HPI: The patient was last seen in the hematology clinic on 06/18/2019. At that time, she felt good. She denied any bleeding.  Hematocrit was 32.6, hemoglobin 10.9, and MCV 87.4 on 06/10/2019.  Ferritin was 22 (low).  She received Venofer.  She was admitted to [facility] from 10/02/2019 - 10/05/2019 with a viral gastroenteritis and hyponatremia. She had symptoms of nausea, vomiting and diarrhea.  Hematocrit was 38.0 and hemoglobin 13.1.  Sodium was 121.  Sodium improved with IVF and oral intake.  She was to stop taking hygroton, lasix, and aldactone.   Abdomen and pelvis CT on 10/03/2019 revealed limited evaluation of the intrapelvic structures secondary to metallic streak artifact. There was diffuse wall thickening of the jejunum and ileum with associated mesenteric edema/stranding. Findings were nonspecific but could be seen in setting of enteritis. There was small volume abdominal pelvic free fluid, nonspecific, findings may be reactive in the setting of above. There was a 2.8 cm fluid collection is seen in the anterolateral soft tissues of the right pelvis with questionable rim enhancement and tiny locules of internal gas. Findings may reflect postoperative seroma/hematoma in the setting of recent hip replacement; developing abscess was also within the differential.   CBC on discharge (10/05/2019) included a hematocrit of 24.3, hemoglobin 8.3, MCV 91.9, platelets 419,000, WBC 4000.  Sodium was 131 and creatinine 1.35.  Ferritin was 176.1 with an iron saturation of 16% and a TIBC of 207.9 on 10/04/2019.  Haptoglobin was 125 and LDH 296  (120-246).  Reticulocyte was 4%.  She saw Dr. Holley Raring on 10/13/2019. Kidney function was stable. Renal/urinary tract ultrasound on 10/12/2019 revealed no acute significant findings. Follow up was planned on 02/12/2020.   Labs followed:  07/17/2019: Hematocrit 37.9, hemoglobin 12.9, MCV 88.6, platelets 315,000, WBC 7800. Ferritin  55.  08/13/2019: Hematocrit 34.3, hemoglobin 11.8, MCV 88.9, platelets 263,000, WBC 5600. Ferritin 39. 09/11/2019: Hematocrit 31.9, hemoglobin 10.9, MCV 90.4, platelets 264,000, WBC 6600. Ferritin 36.  10/05/2019: Hematocrit 24.3, hemoglobin   8.3, MCV 91.9, platelets 419,000, WBC 4000.  10/13/2019: Hematocrit 29.1, hemoglobin   9.4, MCV 92.7, platelets 428,000, WBC 6100. 10/20/2019: Hematocrit 31.9, hemoglobin 10.5, MCV 91.9, platelets 414,000, WBC 8200. Ferritin 82.  Iron saturation 8% and TIBC 326.  Additional labs on 10/20/2019 revealed a sodium 132, creatinine 1.11. Sed rate was 68.  Coombs was negative. Retic was 2.9%.  Peripheral smear on 10/20/2019 revealed a normocytic anemia with minimal anisocytosis.  Findings were not definitive by morphology review, but may be suggestive of iron deficiency, superimposed on other nutritional deficiency, autoimmune process, or rheumatologic disease.  WBC count and differential were normal.  There was mild thrombocytosis with rare giant platelets.  She received the [place] COVID-19 vaccine on 06/01/2019 and 06/23/2019.  During the interim, she has felt "ok". Reports back pain and bilateral feet swelling. She notes the swelling is better than it has been in a long time. She is wearing support stocking and she notes some discomfort when she bends her feet. She is wearing sandals for comfort. She is on lasix. 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 She  denies losing a lot of blood and she did not require any transfusion. She felt good after surgery and was doing well in physical therapy. She has  intermittent neck pain. Denies cough, shortness of breath, bleeding or melena. Her diet is "pitiful". She has no appetite. Her weight is down 11 pounds since 06/2019. She did not follow up with Dr. Alice Reichert in GI. She is taking oral B12. She has no had a bowel movement for 7 days now. I recommended stool softeners, milk of magnesia, and fruits that start with the letter "p".    Past Medical History:  Diagnosis Date  . High cholesterol   . Hypertension   . Thyroid disease     Past Surgical History:  Procedure Laterality Date  . ABDOMINAL HYSTERECTOMY    . APPENDECTOMY    . BREAST BIOPSY Left 07/27/2004   neg  . BREAST SURGERY    . GALLBLADDER SURGERY    . herniated disc      Family History  Problem Relation Age of Onset  . Breast cancer Neg Hx     Social History:  reports that she has never smoked. She has never used smokeless tobacco. She reports that she does not use drugs. No history on file for alcohol use. Granddaughter live with her. She was a retired Clinical biochemist.She lives in [place]. The patient is accompanied by daughter, Amy, today.   Allergies:  Allergies  Allergen Reactions  . Atorvastatin Other (See Comments)    Elevated LFT's  . Codeine Sulfate Other (See Comments)  . Meloxicam Diarrhea  . Other Hives    sporonox   . Simvastatin     Other reaction(s): Liver Disorder Elevated LFT's  . Sulfa Antibiotics Other (See Comments)    Current Medications: Current Outpatient Medications  Medication Sig Dispense Refill  . allopurinol (ZYLOPRIM) 300 MG tablet Take 300 mg by mouth daily.     Marland Kitchen allopurinol (ZYLOPRIM) 300 MG tablet Take by mouth.    Marland Kitchen ammonium lactate (LAC-HYDRIN) 12 % lotion Apply 1 application topically as needed.     . chlorthalidone (HYGROTON) 25 MG tablet Take 25 mg by mouth daily.    . Cyanocobalamin (VITAMIN B 12) 500 MCG TABS Take 500 mcg by mouth daily.     . diclofenac sodium (VOLTAREN) 1 % GEL Apply 2 g topically as  needed.     Mariane Baumgarten Sodium 100 MG capsule Take 200 mg by mouth daily.     . ergocalciferol (VITAMIN D2) 1.25 MG (50000 UT) capsule Take by mouth.    . ezetimibe (ZETIA) 10 MG tablet Take by mouth.    Marland Kitchen FLUoxetine (PROZAC) 20 MG capsule Take 20 mg by mouth daily.    . fluticasone (FLONASE) 50 MCG/ACT nasal spray Place 2 sprays into both nostrils daily.     . furosemide (LASIX) 20 MG tablet Take 20 mg by mouth every other day.     . furosemide (LASIX) 20 MG tablet Take by mouth.    Marland Kitchen HYDROmorphone (DILAUDID) 4 MG tablet Take 4 mg by mouth every 6 (six) hours as needed.     Marland Kitchen HYDROmorphone (DILAUDID) 4 MG tablet Take by mouth.    . levothyroxine (SYNTHROID) 112 MCG tablet Take by mouth.    . levothyroxine (SYNTHROID, LEVOTHROID) 112 MCG tablet Take 112 mcg by mouth daily before breakfast.    . lisinopril (ZESTRIL) 40 MG tablet Take 40 mg by mouth daily.     Marland Kitchen lisinopril (ZESTRIL) 40 MG  tablet Take by mouth.    . metoprolol succinate (TOPROL-XL) 25 MG 24 hr tablet Take 25 mg by mouth daily.     . naloxone (NARCAN) 4 MG/0.1ML LIQD nasal spray kit     . omeprazole (PRILOSEC) 20 MG capsule Take 20 mg by mouth daily.     . ondansetron (ZOFRAN) 4 MG tablet Take by mouth.    . polycarbophil (FIBERCON) 625 MG tablet Take 625 mg by mouth daily.     Marland Kitchen spironolactone (ALDACTONE) 25 MG tablet Take 25 mg by mouth every other day.     . spironolactone (ALDACTONE) 25 MG tablet Take by mouth.    . traZODone (DESYREL) 50 MG tablet Take by mouth.    . Vitamin D, Ergocalciferol, (DRISDOL) 50000 UNITS CAPS capsule Take 50,000 Units by mouth every 7 (seven) days.     . Wheat Dextrin (BENEFIBER PO) Take by mouth daily.     . Wheat Dextrin (BENEFIBER) POWD Take by mouth.    Marland Kitchen ZETIA 10 MG tablet Take 10 mg by mouth daily.     . traZODone (DESYREL) 50 MG tablet Take 50 mg by mouth at bedtime as needed.      No current facility-administered medications for this visit.    Review of Systems  Constitutional:  Positive for weight loss (10 lbs). Negative for chills, diaphoresis, fever and malaise/fatigue.       Doing ok.  HENT: Negative.  Negative for congestion, ear pain, nosebleeds, sinus pain and sore throat.   Eyes: Negative.  Negative for blurred vision, double vision and photophobia.  Respiratory: Negative.  Negative for cough, hemoptysis, sputum production and shortness of breath.   Cardiovascular: Positive for leg swelling (bilateral feet, wearing support stockings, discomfort when bending feet). Negative for chest pain, palpitations and orthopnea.  Gastrointestinal: Positive for constipation (7 days). Negative for abdominal pain, blood in stool, diarrhea, melena, nausea and vomiting.       Diet is "pitiful". No appetite.  Genitourinary: Negative.  Negative for dysuria, frequency, hematuria and urgency.  Musculoskeletal: Positive for back pain. Negative for joint pain, myalgias and neck pain (intermittent).       S/p right hip replacement.  Skin: Negative.  Negative for itching and rash.  Neurological: Negative.  Negative for dizziness, tingling, tremors, sensory change, speech change, focal weakness, weakness and headaches.  Endo/Heme/Allergies: Negative.  Negative for environmental allergies. Does not bruise/bleed easily.  Psychiatric/Behavioral: Negative.  Negative for depression and memory loss. The patient is not nervous/anxious and does not have insomnia.   All other systems reviewed and are negative.  Performance status (ECOG): 1  Vitals Blood pressure (!) 135/42, pulse 73, temperature 97.8 F (36.6 C), temperature source Tympanic, weight 177 lb 12.8 oz (80.7 kg), SpO2 100 %.   Physical Exam Vitals and nursing note reviewed.  Constitutional:      General: She is not in acute distress.    Appearance: She is not diaphoretic.     Interventions: Face mask in place.  HENT:     Head: Normocephalic and atraumatic.     Comments: Short styled blonde hair.    Mouth/Throat:     Pharynx:  No oropharyngeal exudate.  Eyes:     General: No scleral icterus.    Conjunctiva/sclera: Conjunctivae normal.     Pupils: Pupils are equal, round, and reactive to light.     Comments: Blue eyes.  Neck:     Vascular: No JVD.  Cardiovascular:     Rate and Rhythm: Normal  rate.     Heart sounds: Normal heart sounds. No murmur heard.  No gallop.   Pulmonary:     Effort: Pulmonary effort is normal. No respiratory distress.     Breath sounds: Normal breath sounds. No wheezing or rales.  Abdominal:     General: Bowel sounds are normal. There is no distension.     Palpations: Abdomen is soft. There is no mass.     Tenderness: There is no abdominal tenderness. There is no guarding or rebound.  Musculoskeletal:        General: Normal range of motion.     Cervical back: Neck supple.     Right lower leg: Edema present.     Left lower leg: Edema present.  Lymphadenopathy:     Head:     Right side of head: No preauricular, posterior auricular or occipital adenopathy.     Left side of head: No preauricular, posterior auricular or occipital adenopathy.     Cervical: No cervical adenopathy.     Upper Body:     Right upper body: No supraclavicular adenopathy.     Left upper body: No supraclavicular adenopathy.  Skin:    General: Skin is warm and dry.  Neurological:     Mental Status: She is alert and oriented to person, place, and time.  Psychiatric:        Mood and Affect: Mood normal.        Behavior: Behavior normal.        Thought Content: Thought content normal.        Judgment: Judgment normal.     No visits with results within 3 Day(s) from this visit.  Latest known visit with results is:  Appointment on 10/20/2019  Component Date Value Ref Range Status  . Sodium 10/20/2019 132* 135 - 145 mmol/L Final  . Potassium 10/20/2019 3.9  3.5 - 5.1 mmol/L Final  . Chloride 10/20/2019 97* 98 - 111 mmol/L Final  . CO2 10/20/2019 26  22 - 32 mmol/L Final  . Glucose, Bld 10/20/2019 103* 70  - 99 mg/dL Final   Glucose reference range applies only to samples taken after fasting for at least 8 hours.  . BUN 10/20/2019 14  8 - 23 mg/dL Final  . Creatinine, Ser 10/20/2019 1.11* 0.44 - 1.00 mg/dL Final  . Calcium 10/20/2019 9.1  8.9 - 10.3 mg/dL Final  . GFR calc non Af Amer 10/20/2019 48* >60 mL/min Final  . GFR calc Af Amer 10/20/2019 55* >60 mL/min Final  . Anion gap 10/20/2019 9  5 - 15 Final   Performed at [facility], [address] [place], [place] [address]  . Sed Rate 10/20/2019 68* 0 - 30 mm/hr Final   Performed at [facility], [address] [place], [place] [address]  . Path Review 10/20/2019 Blood smear is reviewed.   Final   Comment: Normocytic anemia, with minimal anisocytosis. Review of clinical studies identifies iron deficiency, which would typically be expected to show a microcytic anemia. The findings are not definitive by morphology review, but may be suggestive of iron deficiency, superimposed on other nutritional deficiency, autoimmune process, or rheumatologic disease. Normal WBC count and differential. Mild thrombocytosis with rare giant platelets. Reviewed by Kathi Simpers, M.D. Performed at [facility], [address] [place],  [address]   . Blood Bank Specimen 10/20/2019 SAMPLE AVAILABLE FOR TESTING   Final  . Sample Expiration 10/20/2019    Final  Value:10/23/2019,2359 Performed at [facility], [address], [place], [place] [address]   . Polyspecific AHG test 10/20/2019    Final                   Value:NEG Performed at [facility], [address] [place], [place] [address]   . Retic Ct Pct 10/20/2019 2.9  0.4 - 3.1 % Final  . RBC. 10/20/2019 3.44* 3.87 - 5.11 MIL/uL Final  . Retic Count, Absolute 10/20/2019 100.4  19.0 - 186.0 K/uL Final  . Immature Retic Fract 10/20/2019 11.9  2.3 - 15.9 % Final   Performed at [facility], [address]  [place], [place] [address]  . Iron 10/20/2019 27* 28 - 170 ug/dL Final  . TIBC 10/20/2019 326  250 - 450 ug/dL Final  . Saturation Ratios 10/20/2019 8* 10.4 - 31.8 % Final  . UIBC 10/20/2019 299  ug/dL Final   Performed at [facility], [address] [place], [place] [address]  . Ferritin 10/20/2019 82  11 - 307 ng/mL Final   Performed at [facility], [address] [place], [place] [address]  . WBC 10/20/2019 8.2  4.0 - 10.5 K/uL Final  . RBC 10/20/2019 3.47* 3.87 - 5.11 MIL/uL Final  . Hemoglobin 10/20/2019 10.5* 12.0 - 15.0 g/dL Final  . HCT 10/20/2019 31.9* 36 - 46 % Final  . MCV 10/20/2019 91.9  80.0 - 100.0 fL Final  . MCH 10/20/2019 30.3  26.0 - 34.0 pg Final  . MCHC 10/20/2019 32.9  30.0 - 36.0 g/dL Final  . RDW 10/20/2019 14.4  11.5 - 15.5 % Final  . Platelets 10/20/2019 414* 150 - 400 K/uL Final  . nRBC 10/20/2019 0.0  0.0 - 0.2 % Final  . Neutrophils Relative % 10/20/2019 49  % Final  . Neutro Abs 10/20/2019 4.0  1.7 - 7.7 K/uL Final  . Lymphocytes Relative 10/20/2019 34  % Final  . Lymphs Abs 10/20/2019 2.8  0.7 - 4.0 K/uL Final  . Monocytes Relative 10/20/2019 12  % Final  . Monocytes Absolute 10/20/2019 1.0  0 - 1 K/uL Final  . Eosinophils Relative 10/20/2019 4  % Final  . Eosinophils Absolute 10/20/2019 0.3  0 - 0 K/uL Final  . Basophils Relative 10/20/2019 1  % Final  . Basophils Absolute 10/20/2019 0.1  0 - 0 K/uL Final  . Immature Granulocytes 10/20/2019 0  % Final  . Abs Immature Granulocytes 10/20/2019 0.03  0.00 - 0.07 K/uL Final   Performed at [facility], [address] [place],  [address]    Assessment:  Pascuala Klutts is a 79 y.o. female  with anemia of chronic renal disease. Creatinine was 1.9 on 09/03/2020and 1.67 on 02/13/2019.SPEP and random UPEP on 09/06/2017 revealed no monoclonal protein. Renalultrasoundon 10/11/2017 revealedno acute findings.   Labs on 10/08/2020revealed a hematocrit 29.6,  hemoglobin 9.9, MCV 85.8, platelets 225,000,WBC4700.  Work up on 03/14/2019 revealed a hematocrit 31.8, hemoglobin 10.4, MCV 85.3, platelets 310,000, WBC 7,400. Ferritin was 11 with an iron saturation of 7% and a TIBC of 461. B12 and folate were normal.  Retic was 1.2%. Sed rate was 42.  She has iron deficiency.Dietis poor. Last colonoscopywas 7 years ago. She denies any melena, hematochezia, hematuria or vaginal bleeding.  She is intolerant of oral iron.  She received Venofer on 04/02/2019, 06/18/2019, and 09/12/2019.  She received premedications (Decadron 4 mg, ondansetron 4 mg, and Benadryl 25 mg) secondary  to her sensitivities with medications.  Ferritinhas been followed: 26 on 07/17/2016, 29 on 03/16/2015, 19 on 10/15/2017, 23 on 02/13/2019, 11 on 03/14/2019, 15 on 03/31/2019, 106 on 04/08/2019, 51 on 04/22/2019, 22 on 06/10/2019, 55 on 07/17/2019, 39 on 08/13/2019, 36 on 09/11/2019, and 82 on 10/20/2019. Iron saturation was 7% with a TIBC of 461 on 03/14/2019 and 8% with a TIBC of 326 on 10/20/2019.  She has B12 deficiency. She has been on oral B53fr many years. B12 was 762 on 07/17/2017 and 1137 on 03/14/2019. Folate was 9.7 on 03/14/2019  [place] on 06/01/2019 and 06/23/2019.  [facility] a viral gastroenteritis and hyponatremia (sodium 121).   [place] on 06/01/2019 and 06/23/2019.  Symptomatically, she notes some lower extremity swelling today.  She denies any bleeding.  Plan: 1.   Review interim labs. 2. Iron deficiency Hematocrit 24.3.  Hemoglobin   8.3.  MCV 91.9 on 10/06/2019.   Hematocrit 31.9.  Hemoglobin 10.5.  MCV 81.9 on 10/20/2019.                         Ferritin 82 with an iron saturation of 8% (low). Patient is intolerant of oral iron.             Etiology appears secondary to diet.             Last  colonoscopy 7 years ago.  Discuss follow-up with Dr TAlice Reichert             Patient denies any bleeding. Venofer today with premeds (Decadron, Benadryl, ondansetron).  Encourage patient to follow-up with GI (Dr TAlice Reichert. 3.B12 deficiency Patient on oral B12. B12 was 1137 and and folate 9.7 on 03/14/2019.  Monitor yearly. 4.   Stage IIIb chronic renal disease Creatinine was 1.11 (improved) today. Review plan for Retacrit when iron stores are replete and hemoglobin < 10.  Continue to monitor. 5.   RTC in 2 months for labs (CBC with diff, ferritin, iron studies). 6.   RTC in 4 months for MD assessment, labs (CBC with diff, CMP, ferritin, iron studies, sed rate- day before) and +/- Venofer.  I discussed the assessment and treatment plan with the patient.  The patient was provided an opportunity to ask questions and all were answered.  The patient agreed with the plan and demonstrated an understanding of the instructions.  The patient was advised to call back if the symptoms worsen or if the condition fails to improve as anticipated.    Izzac Rockett C. CMike Gip MD, PhD    10/24/2019, 1:35 PM  I, ASelena Batten am acting as a scribe for MLequita Asal MD.  I, MMeyerCMike Gip MD, have reviewed the above documentation for accuracy and completeness, and I agree with the above.

## 2019-10-24 ENCOUNTER — Inpatient Hospital Stay: Payer: Medicare PPO

## 2019-10-24 ENCOUNTER — Other Ambulatory Visit: Payer: Self-pay

## 2019-10-24 ENCOUNTER — Inpatient Hospital Stay: Payer: Medicare PPO | Admitting: Hematology and Oncology

## 2019-10-24 ENCOUNTER — Encounter: Payer: Self-pay | Admitting: Hematology and Oncology

## 2019-10-24 VITALS — BP 135/42 | HR 73 | Temp 97.8°F | Wt 177.8 lb

## 2019-10-24 VITALS — BP 105/65 | HR 72 | Temp 98.0°F | Resp 18

## 2019-10-24 DIAGNOSIS — D649 Anemia, unspecified: Secondary | ICD-10-CM

## 2019-10-24 DIAGNOSIS — E538 Deficiency of other specified B group vitamins: Secondary | ICD-10-CM

## 2019-10-24 DIAGNOSIS — N183 Chronic kidney disease, stage 3 unspecified: Secondary | ICD-10-CM | POA: Diagnosis not present

## 2019-10-24 DIAGNOSIS — D509 Iron deficiency anemia, unspecified: Secondary | ICD-10-CM

## 2019-10-24 MED ORDER — IRON SUCROSE 20 MG/ML IV SOLN
200.0000 mg | Freq: Once | INTRAVENOUS | Status: AC
  Administered 2019-10-24: 200 mg via INTRAVENOUS
  Filled 2019-10-24: qty 10

## 2019-10-24 MED ORDER — DEXAMETHASONE SODIUM PHOSPHATE 10 MG/ML IJ SOLN
4.0000 mg | Freq: Once | INTRAMUSCULAR | Status: AC
  Administered 2019-10-24: 4 mg via INTRAVENOUS
  Filled 2019-10-24: qty 1

## 2019-10-24 MED ORDER — SODIUM CHLORIDE 0.9 % IV SOLN
Freq: Once | INTRAVENOUS | Status: AC
  Filled 2019-10-24: qty 250

## 2019-10-24 MED ORDER — DIPHENHYDRAMINE HCL 50 MG/ML IJ SOLN
25.0000 mg | Freq: Once | INTRAMUSCULAR | Status: AC
  Administered 2019-10-24: 25 mg via INTRAVENOUS
  Filled 2019-10-24: qty 1

## 2019-10-24 MED ORDER — ONDANSETRON HCL 4 MG/2ML IJ SOLN
4.0000 mg | Freq: Once | INTRAMUSCULAR | Status: AC
  Administered 2019-10-24: 4 mg via INTRAVENOUS

## 2019-10-24 MED ORDER — SODIUM CHLORIDE 0.9 % IV SOLN: 200.0000 mg | Freq: Once | INTRAVENOUS | Status: DC

## 2019-10-24 MED ORDER — SODIUM CHLORIDE 0.9 % IV SOLN: 4.0000 mg | Freq: Once | INTRAVENOUS | Status: DC

## 2019-10-24 NOTE — Progress Notes (Signed)
Patient here for oncology follow-up appointment, expresses complaints of back pain and leg swelling.

## 2019-12-16 ENCOUNTER — Other Ambulatory Visit: Payer: Self-pay

## 2019-12-16 ENCOUNTER — Inpatient Hospital Stay: Payer: Medicare PPO | Attending: Hematology and Oncology

## 2019-12-16 DIAGNOSIS — E538 Deficiency of other specified B group vitamins: Secondary | ICD-10-CM | POA: Diagnosis not present

## 2019-12-16 DIAGNOSIS — N183 Chronic kidney disease, stage 3 unspecified: Secondary | ICD-10-CM | POA: Diagnosis not present

## 2019-12-16 DIAGNOSIS — I129 Hypertensive chronic kidney disease with stage 1 through stage 4 chronic kidney disease, or unspecified chronic kidney disease: Secondary | ICD-10-CM | POA: Insufficient documentation

## 2019-12-16 DIAGNOSIS — D631 Anemia in chronic kidney disease: Secondary | ICD-10-CM | POA: Diagnosis not present

## 2019-12-16 LAB — CBC
HCT: 34.6 % — ABNORMAL LOW (ref 36.0–46.0)
Hemoglobin: 11.5 g/dL — ABNORMAL LOW (ref 12.0–15.0)
MCH: 30 pg (ref 26.0–34.0)
MCHC: 33.2 g/dL (ref 30.0–36.0)
MCV: 90.3 fL (ref 80.0–100.0)
Platelets: 261 10*3/uL (ref 150–400)
RBC: 3.83 MIL/uL — ABNORMAL LOW (ref 3.87–5.11)
RDW: 15.2 % (ref 11.5–15.5)
WBC: 5.7 10*3/uL (ref 4.0–10.5)
nRBC: 0 % (ref 0.0–0.2)

## 2019-12-16 LAB — IRON AND TIBC
Iron: 55 ug/dL (ref 28–170)
Saturation Ratios: 16 % (ref 10.4–31.8)
TIBC: 337 ug/dL (ref 250–450)
UIBC: 282 ug/dL

## 2019-12-16 LAB — FERRITIN: Ferritin: 70 ng/mL (ref 11–307)

## 2019-12-18 ENCOUNTER — Inpatient Hospital Stay: Payer: Medicare PPO

## 2019-12-18 ENCOUNTER — Other Ambulatory Visit: Payer: Medicare PPO

## 2020-02-10 ENCOUNTER — Inpatient Hospital Stay: Payer: Medicare PPO | Attending: Hematology and Oncology

## 2020-02-10 ENCOUNTER — Other Ambulatory Visit: Payer: Self-pay

## 2020-02-10 DIAGNOSIS — R600 Localized edema: Secondary | ICD-10-CM | POA: Diagnosis not present

## 2020-02-10 DIAGNOSIS — I129 Hypertensive chronic kidney disease with stage 1 through stage 4 chronic kidney disease, or unspecified chronic kidney disease: Secondary | ICD-10-CM | POA: Insufficient documentation

## 2020-02-10 DIAGNOSIS — D509 Iron deficiency anemia, unspecified: Secondary | ICD-10-CM | POA: Insufficient documentation

## 2020-02-10 DIAGNOSIS — R634 Abnormal weight loss: Secondary | ICD-10-CM | POA: Insufficient documentation

## 2020-02-10 DIAGNOSIS — D631 Anemia in chronic kidney disease: Secondary | ICD-10-CM | POA: Diagnosis not present

## 2020-02-10 DIAGNOSIS — Z791 Long term (current) use of non-steroidal anti-inflammatories (NSAID): Secondary | ICD-10-CM | POA: Insufficient documentation

## 2020-02-10 DIAGNOSIS — E079 Disorder of thyroid, unspecified: Secondary | ICD-10-CM | POA: Insufficient documentation

## 2020-02-10 DIAGNOSIS — E7800 Pure hypercholesterolemia, unspecified: Secondary | ICD-10-CM | POA: Diagnosis not present

## 2020-02-10 DIAGNOSIS — D75839 Thrombocytosis, unspecified: Secondary | ICD-10-CM | POA: Diagnosis not present

## 2020-02-10 DIAGNOSIS — Z79899 Other long term (current) drug therapy: Secondary | ICD-10-CM | POA: Insufficient documentation

## 2020-02-10 DIAGNOSIS — Z8616 Personal history of COVID-19: Secondary | ICD-10-CM | POA: Diagnosis not present

## 2020-02-10 DIAGNOSIS — E538 Deficiency of other specified B group vitamins: Secondary | ICD-10-CM

## 2020-02-10 LAB — CBC WITH DIFFERENTIAL/PLATELET
Abs Immature Granulocytes: 0.01 10*3/uL (ref 0.00–0.07)
Basophils Absolute: 0.1 10*3/uL (ref 0.0–0.1)
Basophils Relative: 1 %
Eosinophils Absolute: 0.2 10*3/uL (ref 0.0–0.5)
Eosinophils Relative: 4 %
HCT: 36.6 % (ref 36.0–46.0)
Hemoglobin: 12.1 g/dL (ref 12.0–15.0)
Immature Granulocytes: 0 %
Lymphocytes Relative: 37 %
Lymphs Abs: 1.9 10*3/uL (ref 0.7–4.0)
MCH: 29.8 pg (ref 26.0–34.0)
MCHC: 33.1 g/dL (ref 30.0–36.0)
MCV: 90.1 fL (ref 80.0–100.0)
Monocytes Absolute: 0.4 10*3/uL (ref 0.1–1.0)
Monocytes Relative: 8 %
Neutro Abs: 2.6 10*3/uL (ref 1.7–7.7)
Neutrophils Relative %: 50 %
Platelets: 275 10*3/uL (ref 150–400)
RBC: 4.06 MIL/uL (ref 3.87–5.11)
RDW: 14.9 % (ref 11.5–15.5)
WBC: 5.1 10*3/uL (ref 4.0–10.5)
nRBC: 0 % (ref 0.0–0.2)

## 2020-02-10 LAB — VITAMIN B12: Vitamin B-12: 789 pg/mL (ref 180–914)

## 2020-02-10 LAB — FERRITIN: Ferritin: 66 ng/mL (ref 11–307)

## 2020-02-10 LAB — SEDIMENTATION RATE: Sed Rate: 36 mm/hr — ABNORMAL HIGH (ref 0–30)

## 2020-02-10 LAB — FOLATE: Folate: 10.4 ng/mL (ref >5.9)

## 2020-02-10 NOTE — Progress Notes (Signed)
[facility]  [address], [address] [place], [place] [address] Phone: [phone]  Fax: [phone]   Clinic Day:  02/11/2020  Referring physician: Ezequiel Kayser, MD  Chief Complaint: Peggy Phillips is a 79 y.o. female with iron deficiency anemia and anemia of chronic kidney disease who is seen for 4 month assessment.   HPI: The patient was last seen in the hematology clinic on 10/24/2019. At that time, she noted some lower extremity swelling today.  She denied any bleeding. Labs on 10/20/2019 included a hematocrit was 31.9, hemoglobin 10.5, MCV 91.9, platelets 414,000, WBC 8,200.  Ferritin was 82 with an iron saturation of 8% (low) and a TIBC 326.  Sed rate was 68. Coombs was negative. Sodium was 132 and creatinine 1.11 (CrCl 48 ml/min). She continued Vitamin B12. I discussed follow up with Dr. Alice Reichert.  She received Venofer.  Peripheral smear on 10/20/2019 revealed a normocytic anemia, with minimal anisocytosis. Review of clinical studies identified iron deficiency, which would typically be expected to show a microcytic anemia. The findings were not definitive by morphology review, but may be suggestive of iron deficiency, superimposed on other nutritional deficiency, autoimmune process, or rheumatologic disease. WBC count and differential were normal. There was mild thrombocytosis with rare giant platelets.  She tested positive for COVID-19 at [facility] on 12/22/2019.  Labs have been followed: 12/16/2019:  Hematocrit 34.6, hemoglobin 11.5, MCV 90.3, platelets 261,000, WBC 5,700. Ferritin 70 with an iron saturation of 16% and a TIBC of 337. 02/10/2020:  Hematocrit 36.6, hemoglobin 12.1, MCV 90.1, platelets 275,000, WBC 5,100. Ferritin 65.  B12 was 789 and folate 10.4.  During the interim, she has been fine. She has felt better over the past 3 days than she has in months. Her senses of taste and smell are finally starting to come back after her COVID-19 infection.  She received her COVID-19 Booster shot yesterday. She received her flu shot last week. She is still taking oral Vitamin B12. She is happy with her current weight. Her leg swelling has improved. She has occasional constipation.    Past Medical History:  Diagnosis Date  . High cholesterol   . Hypertension   . Thyroid disease     Past Surgical History:  Procedure Laterality Date  . ABDOMINAL HYSTERECTOMY    . APPENDECTOMY    . BREAST BIOPSY Left 07/27/2004   neg  . BREAST SURGERY    . GALLBLADDER SURGERY    . herniated disc      Family History  Problem Relation Age of Onset  . Breast cancer Neg Hx     Social History:  reports that she has never smoked. She has never used smokeless tobacco. She reports that she does not use drugs. No history on file for alcohol use. Granddaughter live with her. She was a retired Clinical biochemist.She lives in [place]. The patient is alone today.   Allergies:  Allergies  Allergen Reactions  . Itraconazole Hives  . Naloxegol Diarrhea and Nausea And Vomiting  . Atorvastatin Other (See Comments)    Elevated LFT's  . Codeine Sulfate Other (See Comments)  . Meloxicam Diarrhea  . Other Hives    sporonox   . Simvastatin     Other reaction(s): Liver Disorder Elevated LFT's  . Sulfa Antibiotics Other (See Comments)  . Sulfasalazine Other (See Comments)    Other reaction(s): Other (See Comments)  . Doxepin     Other reaction(s): Other (See Comments) Other reaction(s): Other (See Comments)  Caused nightmares Caused nightmares    Current Medications: Current Outpatient Medications  Medication Sig Dispense Refill  . allopurinol (ZYLOPRIM) 300 MG tablet Take 300 mg by mouth daily.     Marland Kitchen ammonium lactate (LAC-HYDRIN) 12 % lotion Apply 1 application topically as needed.     . Cyanocobalamin (VITAMIN B 12) 500 MCG TABS Take 500 mcg by mouth daily.     . diclofenac sodium (VOLTAREN) 1 % GEL Apply 2 g topically as needed.     Mariane Baumgarten Sodium 100 MG capsule Take 200 mg by mouth daily.     Marland Kitchen ezetimibe (ZETIA) 10 MG tablet Take by mouth.    Marland Kitchen FLUoxetine (PROZAC) 20 MG capsule Take 20 mg by mouth daily.    . fluticasone (FLONASE) 50 MCG/ACT nasal spray Place 2 sprays into both nostrils daily.     . furosemide (LASIX) 20 MG tablet Take 20 mg by mouth every other day.     Marland Kitchen HYDROmorphone (DILAUDID) 4 MG tablet Take 4 mg by mouth every 6 (six) hours as needed.     Marland Kitchen levothyroxine (SYNTHROID, LEVOTHROID) 112 MCG tablet Take 112 mcg by mouth daily before breakfast.    . lisinopril (ZESTRIL) 40 MG tablet Take 40 mg by mouth daily.     . metoprolol succinate (TOPROL-XL) 25 MG 24 hr tablet Take 25 mg by mouth daily.     Marland Kitchen nystatin-triamcinolone ointment (MYCOLOG) Apply topically.    Marland Kitchen omeprazole (PRILOSEC) 20 MG capsule Take 20 mg by mouth daily.     . polycarbophil (FIBERCON) 625 MG tablet Take 625 mg by mouth daily.     Marland Kitchen spironolactone (ALDACTONE) 25 MG tablet Take 25 mg by mouth every other day.     . spironolactone (ALDACTONE) 25 MG tablet Take by mouth.    . traZODone (DESYREL) 50 MG tablet Take by mouth.    . Vitamin D, Ergocalciferol, (DRISDOL) 50000 UNITS CAPS capsule Take 50,000 Units by mouth every 7 (seven) days.     . Wheat Dextrin (BENEFIBER) POWD Take by mouth.    Marland Kitchen allopurinol (ZYLOPRIM) 100 MG tablet  (Patient not taking: Reported on 02/11/2020)    . allopurinol (ZYLOPRIM) 300 MG tablet Take by mouth. (Patient not taking: Reported on 02/11/2020)    . chlorthalidone (HYGROTON) 25 MG tablet Take 25 mg by mouth daily. (Patient not taking: Reported on 02/11/2020)    . furosemide (LASIX) 20 MG tablet Take by mouth. (Patient not taking: Reported on 02/11/2020)    . HYDROmorphone (DILAUDID) 4 MG tablet Take by mouth. (Patient not taking: Reported on 02/11/2020)    . levothyroxine (SYNTHROID) 112 MCG tablet Take by mouth. (Patient not taking: Reported on 02/11/2020)    . lisinopril (ZESTRIL) 40 MG tablet Take by mouth.      . naloxone (NARCAN) 4 MG/0.1ML LIQD nasal spray kit  (Patient not taking: Reported on 02/11/2020)    . ondansetron (ZOFRAN) 4 MG tablet Take by mouth. (Patient not taking: Reported on 02/11/2020)    . traZODone (DESYREL) 50 MG tablet Take 50 mg by mouth at bedtime as needed.     . Wheat Dextrin (BENEFIBER PO) Take by mouth daily.  (Patient not taking: Reported on 02/11/2020)    . ZETIA 10 MG tablet Take 10 mg by mouth daily.  (Patient not taking: Reported on 02/11/2020)     No current facility-administered medications for this visit.    Review of Systems  Constitutional: Positive for weight loss (6 lbs). Negative for chills,  diaphoresis, fever and malaise/fatigue.       Feels "better."  HENT: Negative.  Negative for congestion, ear discharge, ear pain, hearing loss, nosebleeds, sinus pain, sore throat and tinnitus.        Senses of taste/smell are returning after COVID-19 infection.  Eyes: Negative.  Negative for blurred vision.  Respiratory: Negative.  Negative for cough, hemoptysis, sputum production and shortness of breath.   Cardiovascular: Positive for leg swelling (improved). Negative for chest pain and palpitations.  Gastrointestinal: Positive for constipation (occasional). Negative for abdominal pain, blood in stool, diarrhea, heartburn, melena, nausea and vomiting.       Diet is improving.  Genitourinary: Negative.  Negative for dysuria, frequency, hematuria and urgency.  Musculoskeletal: Negative for back pain, joint pain, myalgias and neck pain.       S/p right hip replacement.  Skin: Negative.  Negative for itching and rash.  Neurological: Negative.  Negative for dizziness, tingling, sensory change, weakness and headaches.  Endo/Heme/Allergies: Negative.  Does not bruise/bleed easily.  Psychiatric/Behavioral: Negative.  Negative for depression and memory loss. The patient is not nervous/anxious and does not have insomnia.   All other systems reviewed and are  negative.  Performance status (ECOG): 1  Vitals Blood pressure (!) 132/53, pulse 65, temperature 99.9 F (37.7 C), temperature source Tympanic, resp. rate 18, weight 171 lb 15.3 oz (78 kg), SpO2 98 %.   Physical Exam Vitals and nursing note reviewed.  Constitutional:      General: She is not in acute distress.    Appearance: She is not diaphoretic.     Interventions: Face mask in place.  HENT:     Head: Normocephalic and atraumatic.     Comments: Short styled blonde hair.    Mouth/Throat:     Pharynx: No oropharyngeal exudate.  Eyes:     General: No scleral icterus.    Conjunctiva/sclera: Conjunctivae normal.     Pupils: Pupils are equal, round, and reactive to light.     Comments: Blue eyes.  Neck:     Vascular: No JVD.  Cardiovascular:     Rate and Rhythm: Normal rate.     Heart sounds: Normal heart sounds. No murmur heard.  No gallop.   Pulmonary:     Effort: Pulmonary effort is normal. No respiratory distress.     Breath sounds: Normal breath sounds. No wheezing or rales.  Abdominal:     General: Bowel sounds are normal. There is no distension.     Palpations: Abdomen is soft. There is no mass.     Tenderness: There is no abdominal tenderness. There is no guarding or rebound.  Musculoskeletal:        General: Normal range of motion.     Cervical back: Neck supple.  Lymphadenopathy:     Head:     Right side of head: No preauricular, posterior auricular or occipital adenopathy.     Left side of head: No preauricular, posterior auricular or occipital adenopathy.     Cervical: No cervical adenopathy.     Upper Body:     Right upper body: No supraclavicular adenopathy.     Left upper body: No supraclavicular adenopathy.  Skin:    General: Skin is warm and dry.  Neurological:     Mental Status: She is alert and oriented to person, place, and time.  Psychiatric:        Mood and Affect: Mood normal.        Behavior: Behavior normal.  Thought Content: Thought  content normal.        Judgment: Judgment normal.     Appointment on 02/10/2020  Component Date Value Ref Range Status  . Sed Rate 02/10/2020 36* 0 - 30 mm/hr Final   Performed at [facility], [address] [place], [place] [address]  . Ferritin 02/10/2020 66  11 - 307 ng/mL Final   Performed at [facility], [address] [place], [place] [address]  . Folate 02/10/2020 10.4  >5.9 ng/mL Final   Performed at [facility], [address] [place], [place] [address]  . Vitamin B-12 02/10/2020 789  180 - 914 pg/mL Final   Comment: (NOTE) This assay is not validated for testing neonatal or myeloproliferative syndrome specimens for Vitamin B12 levels. Performed at [facility], [place] [address] [place], [place] [address]   . WBC 02/10/2020 5.1  4.0 - 10.5 K/uL Final  . RBC 02/10/2020 4.06  3.87 - 5.11 MIL/uL Final  . Hemoglobin 02/10/2020 12.1  12.0 - 15.0 g/dL Final  . HCT 02/10/2020 36.6  36 - 46 % Final  . MCV 02/10/2020 90.1  80.0 - 100.0 fL Final  . MCH 02/10/2020 29.8  26.0 - 34.0 pg Final  . MCHC 02/10/2020 33.1  30.0 - 36.0 g/dL Final  . RDW 02/10/2020 14.9  11.5 - 15.5 % Final  . Platelets 02/10/2020 275  150 - 400 K/uL Final  . nRBC 02/10/2020 0.0  0.0 - 0.2 % Final  . Neutrophils Relative % 02/10/2020 50  % Final  . Neutro Abs 02/10/2020 2.6  1.7 - 7.7 K/uL Final  . Lymphocytes Relative 02/10/2020 37  % Final  . Lymphs Abs 02/10/2020 1.9  0.7 - 4.0 K/uL Final  . Monocytes Relative 02/10/2020 8  % Final  . Monocytes Absolute 02/10/2020 0.4  0 - 1 K/uL Final  . Eosinophils Relative 02/10/2020 4  % Final  . Eosinophils Absolute 02/10/2020 0.2  0 - 0 K/uL Final  . Basophils Relative 02/10/2020 1  % Final  . Basophils Absolute 02/10/2020 0.1  0 - 0 K/uL Final  . Immature Granulocytes 02/10/2020 0  % Final  . Abs Immature Granulocytes 02/10/2020 0.01  0.00 - 0.07 K/uL Final   Performed at [facility], [address] [place], [place] [address]    Assessment:  Peggy Phillips is a 79 y.o. female  with anemia of chronic renal disease. Creatinine was 1.9 on 09/03/2020and 1.67 on 02/13/2019.SPEP and random UPEP on 09/06/2017 revealed no monoclonal protein. Renalultrasoundon 10/11/2017 revealedno acute findings.   Labs on 10/08/2020revealed a hematocrit 29.6, hemoglobin 9.9, MCV 85.8, platelets 225,000,[id number].  Work up on 03/14/2019 revealed a hematocrit 31.8, hemoglobin 10.4, MCV 85.3, platelets 310,000, WBC 7,400. Ferritin was 11 with an iron saturation of 7% and a TIBC of 461. B12 and folate were normal.  Retic was 1.2%. Sed rate was 42.  She has iron deficiency.Dietis poor. Last colonoscopywas 7 years ago. She denies any melena, hematochezia, hematuria or vaginal bleeding.  She is intolerant of oral iron.  She received Venofer on 04/02/2019, 06/18/2019, 09/12/2019, and 10/24/2019.  She received premedications (Decadron 4 mg, ondansetron 4 mg, and Benadryl 25 mg) secondary to her sensitivities with medications.  Ferritinhas been followed: 26 on 07/17/2016, 29 on 03/16/2015, 19 on 10/15/2017, 23 on 02/13/2019, 11 on 03/14/2019, 15 on 03/31/2019, 106 on 04/08/2019, 51 on 04/22/2019, 22 on 06/10/2019, 55 on 07/17/2019, 39 on 08/13/2019, 36 on  09/11/2019, 82 on 10/20/2019, 70 on 12/16/2019, and 66 on 02/10/2020. Iron saturation was 7% with a TIBC of 461 on 03/14/2019 and 8% with a TIBC of 326 on 10/20/2019.  She has B12 deficiency. She has been on oral B29fr many years. B12 was 762 on 07/17/2017, 1137 on 03/14/2019, and 789 on 02/10/2020. Folate was 10.4 on 02/10/2020.  [facility] with a viral gastroenteritis and hyponatremia (sodium 121).   She received the PBrookerCOVID-19 vaccine on 06/01/2019 and 06/23/2019.  [facility] She received her COVID-19 booster on 02/10/2020. She received the flu  vaccine last week.   Symptomatically, she feels fine.  She denies any bleeding.  Exam is stable.  Plan: 1.   Review labs from 02/10/2020. 2. Iron deficiency Hematocrit 24.3.  Hemoglobin   8.3.  MCV 91.9 on 10/06/2019.   Hematocrit 31.9.  Hemoglobin 10.5.  MCV 81.9 on 10/20/2019.   Ferritin 82 with an iron saturation of 8% (low) and a TIBC 326.   She received Venofer.  Hematocrit 34.6.  Hemoglobin 11.5.  MCV 90.3 on 12/16/2019.    Ferritin 70 with an iron saturation of 16% and a TIBC of 337.  Hematocrit 36.6.  Hemoglobin 12.1.  MCV 90.1 on 02/10/2020.   Ferritin 65.  Etiology appears secondary to diet. Patient denies bleeding. Encourage follow-up with Dr TAlice Reichert(GI). 3.B12 deficiency Patient on oral B12. B12 was 789 and folate 10.4 on 02/10/2020.  Monitor yearly. 4.   Stage IIIb chronic renal disease Creatinine was 1.11 (improved) on 10/20/2019. patient to receive Retacrit when iron stores are replete and hemoglobin < 10.  Continue to monitor. 5.   Schedule f/u with Dr TAlice Reichert 6.   RTC in 3 months for labs (CBC with diff, ferritin). 7.   RTC in 6 months for MD assessment, labs (CBC with diff, ferritin, iron studies- day before) and +/- Venofer.  I discussed the assessment and treatment plan with the patient.  The patient was provided an opportunity to ask questions and all were answered.  The patient agreed with the plan and demonstrated an understanding of the instructions.  The patient was advised to call back if the symptoms worsen or if the condition fails to improve as anticipated.    Arianne Klinge C. CMike Gip MD, PhD    02/11/2020, 1:27 PM  I, EMirian MoTufford, am acting as a sEducation administratorfor MLequita Asal MD.  I, MBlue DiamondCMike Gip MD, have reviewed the above documentation for accuracy and completeness, and I agree with the above.

## 2020-02-11 ENCOUNTER — Inpatient Hospital Stay: Payer: Medicare PPO

## 2020-02-11 ENCOUNTER — Inpatient Hospital Stay (HOSPITAL_BASED_OUTPATIENT_CLINIC_OR_DEPARTMENT_OTHER): Payer: Medicare PPO | Admitting: Hematology and Oncology

## 2020-02-11 ENCOUNTER — Other Ambulatory Visit: Payer: Self-pay

## 2020-02-11 ENCOUNTER — Encounter: Payer: Self-pay | Admitting: Hematology and Oncology

## 2020-02-11 VITALS — BP 132/53 | HR 65 | Temp 99.9°F | Resp 18 | Wt 172.0 lb

## 2020-02-11 DIAGNOSIS — E538 Deficiency of other specified B group vitamins: Secondary | ICD-10-CM | POA: Diagnosis not present

## 2020-02-11 DIAGNOSIS — N183 Chronic kidney disease, stage 3 unspecified: Secondary | ICD-10-CM

## 2020-02-11 DIAGNOSIS — I129 Hypertensive chronic kidney disease with stage 1 through stage 4 chronic kidney disease, or unspecified chronic kidney disease: Secondary | ICD-10-CM | POA: Diagnosis not present

## 2020-02-11 DIAGNOSIS — D509 Iron deficiency anemia, unspecified: Secondary | ICD-10-CM | POA: Diagnosis not present

## 2020-05-13 ENCOUNTER — Inpatient Hospital Stay: Payer: Medicare PPO

## 2020-05-17 ENCOUNTER — Other Ambulatory Visit: Payer: Self-pay

## 2020-05-17 ENCOUNTER — Inpatient Hospital Stay: Payer: Medicare PPO | Attending: Hematology and Oncology

## 2020-05-17 DIAGNOSIS — N183 Chronic kidney disease, stage 3 unspecified: Secondary | ICD-10-CM | POA: Diagnosis not present

## 2020-05-17 DIAGNOSIS — E538 Deficiency of other specified B group vitamins: Secondary | ICD-10-CM | POA: Insufficient documentation

## 2020-05-17 DIAGNOSIS — D509 Iron deficiency anemia, unspecified: Secondary | ICD-10-CM | POA: Diagnosis present

## 2020-05-17 DIAGNOSIS — D631 Anemia in chronic kidney disease: Secondary | ICD-10-CM | POA: Diagnosis not present

## 2020-05-17 LAB — CBC WITH DIFFERENTIAL/PLATELET
Abs Immature Granulocytes: 0.02 10*3/uL (ref 0.00–0.07)
Basophils Absolute: 0.1 10*3/uL (ref 0.0–0.1)
Basophils Relative: 1 %
Eosinophils Absolute: 0.2 10*3/uL (ref 0.0–0.5)
Eosinophils Relative: 4 %
HCT: 37.1 % (ref 36.0–46.0)
Hemoglobin: 12.5 g/dL (ref 12.0–15.0)
Immature Granulocytes: 0 %
Lymphocytes Relative: 41 %
Lymphs Abs: 2.2 10*3/uL (ref 0.7–4.0)
MCH: 29.8 pg (ref 26.0–34.0)
MCHC: 33.7 g/dL (ref 30.0–36.0)
MCV: 88.5 fL (ref 80.0–100.0)
Monocytes Absolute: 0.5 10*3/uL (ref 0.1–1.0)
Monocytes Relative: 9 %
Neutro Abs: 2.4 10*3/uL (ref 1.7–7.7)
Neutrophils Relative %: 45 %
Platelets: 276 10*3/uL (ref 150–400)
RBC: 4.19 MIL/uL (ref 3.87–5.11)
RDW: 14.1 % (ref 11.5–15.5)
WBC: 5.3 10*3/uL (ref 4.0–10.5)
nRBC: 0 % (ref 0.0–0.2)

## 2020-05-17 LAB — FERRITIN: Ferritin: 48 ng/mL (ref 11–307)

## 2020-08-10 ENCOUNTER — Telehealth: Payer: Self-pay | Admitting: Hematology and Oncology

## 2020-08-10 NOTE — Telephone Encounter (Signed)
Wants to cancel appt on Thursday 4/7 due to having dental surgery. Patient states will call back to reschedule appt

## 2020-08-11 ENCOUNTER — Inpatient Hospital Stay: Payer: Medicare PPO

## 2020-08-12 ENCOUNTER — Inpatient Hospital Stay: Payer: Medicare PPO | Admitting: Hematology and Oncology

## 2020-08-12 ENCOUNTER — Inpatient Hospital Stay: Payer: Medicare PPO

## 2022-06-07 ENCOUNTER — Telehealth: Payer: Self-pay

## 2022-06-07 NOTE — Telephone Encounter (Signed)
Introductory phone call made to patient prior to her new patient appointment with Dr. Tasia Catchings on 06/08/22 at 3:00pm. I received patient's voicemail. Message was left with me introducing myself and giving patient directions to the cancer center. I informed patient that she could call back if she has any questions and if not then we look forward to seeing her tomorrow.

## 2022-06-08 ENCOUNTER — Encounter: Payer: Self-pay | Admitting: Oncology

## 2022-06-08 ENCOUNTER — Inpatient Hospital Stay: Payer: Medicare PPO | Attending: Oncology | Admitting: Oncology

## 2022-06-08 ENCOUNTER — Inpatient Hospital Stay: Payer: Medicare PPO

## 2022-06-08 VITALS — BP 126/50 | HR 65 | Temp 97.5°F | Wt 194.8 lb

## 2022-06-08 DIAGNOSIS — N183 Chronic kidney disease, stage 3 unspecified: Secondary | ICD-10-CM | POA: Insufficient documentation

## 2022-06-08 DIAGNOSIS — I129 Hypertensive chronic kidney disease with stage 1 through stage 4 chronic kidney disease, or unspecified chronic kidney disease: Secondary | ICD-10-CM | POA: Insufficient documentation

## 2022-06-08 DIAGNOSIS — D649 Anemia, unspecified: Secondary | ICD-10-CM

## 2022-06-08 DIAGNOSIS — D508 Other iron deficiency anemias: Secondary | ICD-10-CM | POA: Diagnosis not present

## 2022-06-08 DIAGNOSIS — D509 Iron deficiency anemia, unspecified: Secondary | ICD-10-CM | POA: Diagnosis present

## 2022-06-08 DIAGNOSIS — Z90710 Acquired absence of both cervix and uterus: Secondary | ICD-10-CM | POA: Insufficient documentation

## 2022-06-08 LAB — CBC WITH DIFFERENTIAL/PLATELET
Abs Immature Granulocytes: 0.01 10*3/uL (ref 0.00–0.07)
Basophils Absolute: 0.1 10*3/uL (ref 0.0–0.1)
Basophils Relative: 1 %
Eosinophils Absolute: 0.2 10*3/uL (ref 0.0–0.5)
Eosinophils Relative: 3 %
HCT: 33.3 % — ABNORMAL LOW (ref 36.0–46.0)
Hemoglobin: 10.4 g/dL — ABNORMAL LOW (ref 12.0–15.0)
Immature Granulocytes: 0 %
Lymphocytes Relative: 43 %
Lymphs Abs: 2.4 10*3/uL (ref 0.7–4.0)
MCH: 24.6 pg — ABNORMAL LOW (ref 26.0–34.0)
MCHC: 31.2 g/dL (ref 30.0–36.0)
MCV: 78.9 fL — ABNORMAL LOW (ref 80.0–100.0)
Monocytes Absolute: 0.7 10*3/uL (ref 0.1–1.0)
Monocytes Relative: 12 %
Neutro Abs: 2.4 10*3/uL (ref 1.7–7.7)
Neutrophils Relative %: 41 %
Platelets: 262 10*3/uL (ref 150–400)
RBC: 4.22 MIL/uL (ref 3.87–5.11)
RDW: 16.8 % — ABNORMAL HIGH (ref 11.5–15.5)
WBC: 5.8 10*3/uL (ref 4.0–10.5)
nRBC: 0 % (ref 0.0–0.2)

## 2022-06-08 LAB — RETIC PANEL
Immature Retic Fract: 7.5 % (ref 2.3–15.9)
RBC.: 4.2 MIL/uL (ref 3.87–5.11)
Retic Count, Absolute: 62.2 10*3/uL (ref 19.0–186.0)
Retic Ct Pct: 1.5 % (ref 0.4–3.1)
Reticulocyte Hemoglobin: 24.8 pg — ABNORMAL LOW (ref >27.9)

## 2022-06-08 LAB — VITAMIN B12: Vitamin B-12: 2916 pg/mL — ABNORMAL HIGH (ref 180–914)

## 2022-06-08 LAB — IRON AND TIBC
Iron: 29 ug/dL (ref 28–170)
Saturation Ratios: 7 % — ABNORMAL LOW (ref 10.4–31.8)
TIBC: 417 ug/dL (ref 250–450)
UIBC: 388 ug/dL

## 2022-06-08 LAB — FERRITIN: Ferritin: 12 ng/mL (ref 11–307)

## 2022-06-08 LAB — FOLATE: Folate: 9.2 ng/mL (ref >5.9)

## 2022-06-08 NOTE — Progress Notes (Signed)
Patient is referred here by Michaelle Copas for anemia.

## 2022-06-10 ENCOUNTER — Encounter: Payer: Self-pay | Admitting: Hematology and Oncology

## 2022-06-10 NOTE — Assessment & Plan Note (Addendum)
Encourage oral hydration and avoid nephrotoxins.  Patient also may have a component of anemia secondary to chronic kidney disease.  Recommend further improve her iron stores with IV Venofer treatments.

## 2022-06-10 NOTE — Progress Notes (Addendum)
Hematology/Oncology Consult note [phone] [phone]     REFERRING PROVIDER: Ezequiel Kayser, MD  CHIEF COMPLAINTS/REASON FOR VISIT:  Anemia  ASSESSMENT & PLAN:   Iron deficiency anemia Anemia workup showed decreased ferritin, and iron saturation.  Consistent with iron deficiency. I discussed about option of continue oral iron supplementation and repeat blood work for evaluation of treatment response.  If no significant improvement, then proceed with IV Venofer treatments. Alternative option of proceed with IV Venofer treatments. I discussed about the potential risks including but not limited to allergic reactions/infusion reactions including anaphylactic reactions, phlebitis, high blood pressure, wheezing, SOB, skin rash, weight gain,dark urine, leg swelling, back pain, headache, nausea and fatigue, etc. patient is interested in proceeding with Venofer treatments. Plan Venofer weekly x 4. Previous infusion reaction to Venofer,she is able to tolerate Venofer with premedication (Decadron 4 mg, ondansetron 4 mg, and Benadryl 25 mg   Chronic kidney disease (CKD), stage III (moderate) (HCC) Encourage oral hydration and avoid nephrotoxins.  Patient also may have a component of anemia secondary to chronic kidney disease.  Recommend further improve her iron stores with IV Venofer treatments.  Orders Placed This Encounter  Procedures   CBC with Differential/Platelet    Standing Status:   Future    Number of Occurrences:   1    Standing Expiration Date:   06/09/2023   Ferritin    Standing Status:   Future    Number of Occurrences:   1    Standing Expiration Date:   12/07/2022   Iron and TIBC    Standing Status:   Future    Number of Occurrences:   1    Standing Expiration Date:   06/09/2023   Retic Panel    Standing Status:   Future    Number of Occurrences:   1    Standing Expiration Date:   06/09/2023   Vitamin B12    Standing Status:   Future    Number of  Occurrences:   1    Standing Expiration Date:   06/09/2023   Folate    Standing Status:   Future    Number of Occurrences:   1    Standing Expiration Date:   06/09/2023   Multiple Myeloma Panel (SPEP&IFE w/QIG)    Standing Status:   Future    Number of Occurrences:   1    Standing Expiration Date:   06/09/2023   Kappa/lambda light chains    Standing Status:   Future    Number of Occurrences:   1    Standing Expiration Date:   06/09/2023   Follow-up:4 months.  All questions were answered. The patient knows to call the clinic with any problems, questions or concerns.  Earlie Server, MD, PhD [facility] Hematology Oncology 06/08/2022     HISTORY OF PRESENTING ILLNESS:  Peggy Phillips is a  82 y.o.  female with PMH listed below who was referred to me for anemia Reviewed patient's recent labs that was done.  05/30/2022, patient's CBC showed hemoglobin 10.8, MCV 81.6, normal platelet count and white count. Anemia onset is new.+ Fatigue Patient reports a history of iron deficiency anemia, patient takes Slow Fe Chronic kidney disease stage III.  She denies recent chest pain on exertion, shortness of breath on minimal exertion, pre-syncopal episodes, or palpitations She had not noticed any recent bleeding such as epistaxis, hematuria or hematochezia.  She denies over the counter NSAID ingestion. She is not  on antiplatelets agents.  MEDICAL HISTORY:  Past Medical History:  Diagnosis Date   High cholesterol    Hypertension    Thyroid disease     SURGICAL HISTORY: Past Surgical History:  Procedure Laterality Date   ABDOMINAL HYSTERECTOMY     APPENDECTOMY     BREAST BIOPSY Left 07/27/2004   neg   BREAST SURGERY     GALLBLADDER SURGERY     herniated disc      SOCIAL HISTORY: Social History   Socioeconomic History   Marital status: Widowed    Spouse name: Not on file   Number of children: Not on file   Years of education: Not on file   Highest education level: Not on file   Occupational History   Not on file  Tobacco Use   Smoking status: Never   Smokeless tobacco: Never  Vaping Use   Vaping Use: Never used  Substance and Sexual Activity   Alcohol use: Not on file   Drug use: Never   Sexual activity: Not Currently  Other Topics Concern   Not on file  Social History Narrative   Not on file   Social Determinants of Health   Financial Resource Strain: Low Risk  (06/08/2022)   Overall Financial Resource Strain (CARDIA)    Difficulty of Paying Living Expenses: Not hard at all  Food Insecurity: No Food Insecurity (06/08/2022)   Hunger Vital Sign    Worried About Running Out of Food in the Last Year: Never true    Ran Out of Food in the Last Year: Never true  Transportation Needs: No Transportation Needs (06/08/2022)   PRAPARE - Administrator, Civil Service (Medical): No    Lack of Transportation (Non-Medical): No  Physical Activity: Inactive (06/08/2022)   Exercise Vital Sign    Days of Exercise per Week: 0 days    Minutes of Exercise per Session: 0 min  Stress: No Stress Concern Present (06/08/2022)   [employer] of Occupational Health - Occupational Stress Questionnaire    Feeling of Stress : Not at all  Social Connections: Moderately Integrated (06/08/2022)   Social Connection and Isolation Panel [NHANES]    Frequency of Communication with Friends and Family: More than three times a week    Frequency of Social Gatherings with Friends and Family: Once a week    Attends Religious Services: More than 4 times per year    Active Member of [employer] or Organizations: No    Attends Engineer, structural: More than 4 times per year    Marital Status: Widowed  Catering manager Violence: Not on file    FAMILY HISTORY: Family History  Problem Relation Age of Onset   Heart disease Mother    Heart disease Other    Breast cancer Neg Hx     ALLERGIES:  is allergic to itraconazole, naloxegol, atorvastatin, codeine sulfate, meloxicam,  other, simvastatin, sulfa antibiotics, sulfasalazine, and doxepin.  MEDICATIONS:  Current Outpatient Medications  Medication Sig Dispense Refill   allopurinol (ZYLOPRIM) 300 MG tablet Take 300 mg by mouth daily.      ammonium lactate (LAC-HYDRIN) 12 % lotion Apply 1 application topically as needed.      Cyanocobalamin (VITAMIN B 12) 500 MCG TABS Take 500 mcg by mouth daily.      diclofenac sodium (VOLTAREN) 1 % GEL Apply 2 g topically as needed.      Docusate Sodium 100 MG capsule Take 200 mg by mouth daily.      ezetimibe (ZETIA) 10  MG tablet Take by mouth.     FLUoxetine (PROZAC) 20 MG capsule Take 20 mg by mouth daily.     fluticasone (FLONASE) 50 MCG/ACT nasal spray Place 2 sprays into both nostrils daily.      furosemide (LASIX) 20 MG tablet Take 20 mg by mouth every other day.      HYDROmorphone (DILAUDID) 4 MG tablet Take 4 mg by mouth every 6 (six) hours as needed.      levothyroxine (SYNTHROID) 112 MCG tablet Take by mouth.     lisinopril (ZESTRIL) 40 MG tablet Take 40 mg by mouth daily.      metoprolol succinate (TOPROL-XL) 25 MG 24 hr tablet Take 25 mg by mouth daily.      omeprazole (PRILOSEC) 20 MG capsule Take 20 mg by mouth daily.      ondansetron (ZOFRAN) 4 MG tablet Take by mouth.     polycarbophil (FIBERCON) 625 MG tablet Take 625 mg by mouth daily.      Vitamin D, Ergocalciferol, (DRISDOL) 50000 UNITS CAPS capsule Take 50,000 Units by mouth every 7 (seven) days.      Wheat Dextrin (BENEFIBER PO) Take by mouth daily.     ZETIA 10 MG tablet Take 10 mg by mouth daily.     naloxone (NARCAN) 4 MG/0.1ML LIQD nasal spray kit  (Patient not taking: Reported on 02/11/2020)     No current facility-administered medications for this visit.    Review of Systems  Constitutional:  Positive for fatigue. Negative for appetite change, chills and fever.  HENT:   Negative for hearing loss and voice change.   Eyes:  Negative for eye problems.  Respiratory:  Negative for chest tightness  and cough.   Cardiovascular:  Negative for chest pain.  Gastrointestinal:  Negative for abdominal distention, abdominal pain and blood in stool.  Endocrine: Negative for hot flashes.  Genitourinary:  Negative for difficulty urinating and frequency.   Musculoskeletal:  Negative for arthralgias.  Skin:  Negative for itching and rash.  Neurological:  Negative for extremity weakness.  Hematological:  Negative for adenopathy.  Psychiatric/Behavioral:  Negative for confusion.     PHYSICAL EXAMINATION: ECOG PERFORMANCE STATUS: 1 - Symptomatic but completely ambulatory Vitals:   06/08/22 1509  BP: (!) 126/50  Pulse: 65  Temp: (!) 97.5 F (36.4 C)  SpO2: 98%   Filed Weights   06/08/22 1509  Weight: 194 lb 12.8 oz (88.4 kg)    Physical Exam Constitutional:      General: She is not in acute distress. HENT:     Head: Normocephalic and atraumatic.  Eyes:     General: No scleral icterus. Cardiovascular:     Rate and Rhythm: Normal rate.  Pulmonary:     Effort: Pulmonary effort is normal. No respiratory distress.     Breath sounds: No wheezing.  Abdominal:     General: Bowel sounds are normal. There is no distension.     Palpations: Abdomen is soft.  Musculoskeletal:        General: No deformity. Normal range of motion.     Cervical back: Normal range of motion and neck supple.  Skin:    General: Skin is warm and dry.     Findings: No erythema or rash.  Neurological:     Mental Status: She is alert and oriented to person, place, and time. Mental status is at baseline.     Cranial Nerves: No cranial nerve deficit.     Coordination: Coordination normal.  Psychiatric:  Mood and Affect: Mood normal.      LABORATORY DATA:  I have reviewed the data as listed    Latest Ref Rng & Units 06/08/2022    3:52 PM 05/17/2020    3:48 PM 02/10/2020    2:40 PM  CBC  WBC 4.0 - 10.5 K/uL 5.8  5.3  5.1   Hemoglobin 12.0 - 15.0 g/dL 10.4  12.5  12.1   Hematocrit 36.0 - 46.0 % 33.3   37.1  36.6   Platelets 150 - 400 K/uL 262  276  275       Latest Ref Rng & Units 10/20/2019    3:40 PM  CMP  Glucose 70 - 99 mg/dL 103   BUN 8 - 23 mg/dL 14   Creatinine 0.44 - 1.00 mg/dL 1.11   Sodium 135 - 145 mmol/L 132   Potassium 3.5 - 5.1 mmol/L 3.9   Chloride 98 - 111 mmol/L 97   CO2 22 - 32 mmol/L 26   Calcium 8.9 - 10.3 mg/dL 9.1       Component Value Date/Time   IRON 29 06/08/2022 1552   TIBC 417 06/08/2022 1552   FERRITIN 12 06/08/2022 1552   IRONPCTSAT 7 (L) 06/08/2022 1552     RADIOGRAPHIC STUDIES: I have personally reviewed the radiological images as listed and agreed with the findings in the report. No results found.

## 2022-06-10 NOTE — Assessment & Plan Note (Addendum)
Anemia workup showed decreased ferritin, and iron saturation.  Consistent with iron deficiency. I discussed about option of continue oral iron supplementation and repeat blood work for evaluation of treatment response.  If no significant improvement, then proceed with IV Venofer treatments. Alternative option of proceed with IV Venofer treatments. I discussed about the potential risks including but not limited to allergic reactions/infusion reactions including anaphylactic reactions, phlebitis, high blood pressure, wheezing, SOB, skin rash, weight gain,dark urine, leg swelling, back pain, headache, nausea and fatigue, etc. patient is interested in proceeding with Venofer treatments. Plan Venofer weekly x 4. Previous infusion reaction to Venofer,she is able to tolerate Venofer with premedication (Decadron 4 mg, ondansetron 4 mg, and Benadryl 25 mg

## 2022-06-12 ENCOUNTER — Telehealth: Payer: Self-pay

## 2022-06-12 DIAGNOSIS — D508 Other iron deficiency anemias: Secondary | ICD-10-CM

## 2022-06-12 LAB — KAPPA/LAMBDA LIGHT CHAINS
Kappa free light chain: 97.1 mg/L — ABNORMAL HIGH (ref 3.3–19.4)
Kappa, lambda light chain ratio: 2.03 — ABNORMAL HIGH (ref 0.26–1.65)
Lambda free light chains: 47.8 mg/L — ABNORMAL HIGH (ref 5.7–26.3)

## 2022-06-12 NOTE — Telephone Encounter (Signed)
-----   Message from Earlie Server, MD sent at 06/10/2022  2:13 PM EST ----- Please let patient know that her iron level is low  Recommend IV venfoer weekly x4  Follow up in 4 months, lab prior to MD +/- venofer

## 2022-06-12 NOTE — Telephone Encounter (Signed)
Patient informed and verbalized understanding. However she will have surgery on 2/15 and will not be able to come for weekly infusions while she is recovering.  Pt agrees to schedule first iron infusion this week and 4 months follow up and then she will call back to set up the remaining 3 infusions.

## 2022-06-13 ENCOUNTER — Encounter: Payer: Self-pay | Admitting: Hematology and Oncology

## 2022-06-15 ENCOUNTER — Inpatient Hospital Stay: Payer: Medicare PPO

## 2022-06-15 VITALS — BP 112/42 | HR 57 | Temp 97.7°F | Resp 18

## 2022-06-15 DIAGNOSIS — D649 Anemia, unspecified: Secondary | ICD-10-CM

## 2022-06-15 DIAGNOSIS — D509 Iron deficiency anemia, unspecified: Secondary | ICD-10-CM | POA: Diagnosis not present

## 2022-06-15 LAB — MULTIPLE MYELOMA PANEL, SERUM
Albumin SerPl Elph-Mcnc: 3.7 g/dL (ref 2.9–4.4)
Albumin/Glob SerPl: 1.2 (ref 0.7–1.7)
Alpha 1: 0.3 g/dL (ref 0.0–0.4)
Alpha2 Glob SerPl Elph-Mcnc: 0.7 g/dL (ref 0.4–1.0)
B-Globulin SerPl Elph-Mcnc: 0.9 g/dL (ref 0.7–1.3)
Gamma Glob SerPl Elph-Mcnc: 1.4 g/dL (ref 0.4–1.8)
Globulin, Total: 3.3 g/dL (ref 2.2–3.9)
IgA: 207 mg/dL (ref 64–422)
IgG (Immunoglobin G), Serum: 1136 mg/dL (ref 586–1602)
IgM (Immunoglobulin M), Srm: 340 mg/dL — ABNORMAL HIGH (ref 26–217)
Total Protein ELP: 7 g/dL (ref 6.0–8.5)

## 2022-06-15 MED ORDER — SODIUM CHLORIDE 0.9 % IV SOLN: 4.0000 mg | Freq: Once | INTRAVENOUS | Status: DC

## 2022-06-15 MED ORDER — ONDANSETRON HCL 4 MG/2ML IJ SOLN
4.0000 mg | Freq: Once | INTRAMUSCULAR | Status: AC
  Administered 2022-06-15: 4 mg via INTRAVENOUS
  Filled 2022-06-15: qty 2

## 2022-06-15 MED ORDER — SODIUM CHLORIDE 0.9 % IV SOLN
200.0000 mg | Freq: Once | INTRAVENOUS | Status: AC
  Administered 2022-06-15: 200 mg via INTRAVENOUS
  Filled 2022-06-15: qty 290.91

## 2022-06-15 MED ORDER — DIPHENHYDRAMINE HCL 50 MG/ML IJ SOLN
25.0000 mg | Freq: Once | INTRAMUSCULAR | Status: AC
  Administered 2022-06-15: 25 mg via INTRAVENOUS
  Filled 2022-06-15: qty 1

## 2022-06-15 MED ORDER — DEXAMETHASONE SODIUM PHOSPHATE 10 MG/ML IJ SOLN
4.0000 mg | Freq: Once | INTRAMUSCULAR | Status: AC
  Administered 2022-06-15: 4 mg via INTRAVENOUS
  Filled 2022-06-15: qty 1

## 2022-06-15 MED ORDER — SODIUM CHLORIDE 0.9 % IV SOLN
Freq: Once | INTRAVENOUS | Status: AC
  Filled 2022-06-15: qty 250

## 2022-07-18 DIAGNOSIS — E039 Hypothyroidism, unspecified: Secondary | ICD-10-CM | POA: Diagnosis not present

## 2022-07-18 DIAGNOSIS — E1169 Type 2 diabetes mellitus with other specified complication: Secondary | ICD-10-CM | POA: Diagnosis not present

## 2022-07-18 DIAGNOSIS — E1159 Type 2 diabetes mellitus with other circulatory complications: Secondary | ICD-10-CM | POA: Diagnosis not present

## 2022-07-18 DIAGNOSIS — E1122 Type 2 diabetes mellitus with diabetic chronic kidney disease: Secondary | ICD-10-CM | POA: Diagnosis not present

## 2022-07-18 DIAGNOSIS — N1832 Chronic kidney disease, stage 3b: Secondary | ICD-10-CM | POA: Diagnosis not present

## 2022-07-18 DIAGNOSIS — Z0000 Encounter for general adult medical examination without abnormal findings: Secondary | ICD-10-CM | POA: Diagnosis not present

## 2022-07-18 DIAGNOSIS — F3340 Major depressive disorder, recurrent, in remission, unspecified: Secondary | ICD-10-CM | POA: Diagnosis not present

## 2022-07-18 DIAGNOSIS — M8589 Other specified disorders of bone density and structure, multiple sites: Secondary | ICD-10-CM | POA: Diagnosis not present

## 2022-07-18 DIAGNOSIS — I5030 Unspecified diastolic (congestive) heart failure: Secondary | ICD-10-CM | POA: Diagnosis not present

## 2022-07-18 DIAGNOSIS — N2581 Secondary hyperparathyroidism of renal origin: Secondary | ICD-10-CM | POA: Diagnosis not present

## 2022-07-18 DIAGNOSIS — E538 Deficiency of other specified B group vitamins: Secondary | ICD-10-CM | POA: Diagnosis not present

## 2022-07-18 DIAGNOSIS — I110 Hypertensive heart disease with heart failure: Secondary | ICD-10-CM | POA: Diagnosis not present

## 2022-07-18 DIAGNOSIS — R7989 Other specified abnormal findings of blood chemistry: Secondary | ICD-10-CM | POA: Diagnosis not present

## 2022-07-18 DIAGNOSIS — I152 Hypertension secondary to endocrine disorders: Secondary | ICD-10-CM | POA: Diagnosis not present

## 2022-07-18 DIAGNOSIS — N183 Chronic kidney disease, stage 3 unspecified: Secondary | ICD-10-CM | POA: Diagnosis not present

## 2022-07-18 DIAGNOSIS — R0683 Snoring: Secondary | ICD-10-CM | POA: Diagnosis not present

## 2022-07-18 DIAGNOSIS — E559 Vitamin D deficiency, unspecified: Secondary | ICD-10-CM | POA: Diagnosis not present

## 2022-07-18 DIAGNOSIS — E785 Hyperlipidemia, unspecified: Secondary | ICD-10-CM | POA: Diagnosis not present

## 2022-07-18 DIAGNOSIS — E669 Obesity, unspecified: Secondary | ICD-10-CM | POA: Diagnosis not present

## 2022-07-20 DIAGNOSIS — M5412 Radiculopathy, cervical region: Secondary | ICD-10-CM | POA: Diagnosis not present

## 2022-07-20 DIAGNOSIS — R202 Paresthesia of skin: Secondary | ICD-10-CM | POA: Diagnosis not present

## 2022-07-20 DIAGNOSIS — M1711 Unilateral primary osteoarthritis, right knee: Secondary | ICD-10-CM | POA: Diagnosis not present

## 2022-07-20 DIAGNOSIS — Z79891 Long term (current) use of opiate analgesic: Secondary | ICD-10-CM | POA: Diagnosis not present

## 2022-07-20 DIAGNOSIS — M25551 Pain in right hip: Secondary | ICD-10-CM | POA: Diagnosis not present

## 2022-07-20 DIAGNOSIS — R52 Pain, unspecified: Secondary | ICD-10-CM | POA: Diagnosis not present

## 2022-07-20 DIAGNOSIS — M5416 Radiculopathy, lumbar region: Secondary | ICD-10-CM | POA: Diagnosis not present

## 2022-07-20 DIAGNOSIS — M792 Neuralgia and neuritis, unspecified: Secondary | ICD-10-CM | POA: Diagnosis not present

## 2022-07-20 DIAGNOSIS — M542 Cervicalgia: Secondary | ICD-10-CM | POA: Diagnosis not present

## 2022-07-22 DIAGNOSIS — I130 Hypertensive heart and chronic kidney disease with heart failure and stage 1 through stage 4 chronic kidney disease, or unspecified chronic kidney disease: Secondary | ICD-10-CM | POA: Diagnosis not present

## 2022-07-22 DIAGNOSIS — E860 Dehydration: Secondary | ICD-10-CM | POA: Diagnosis not present

## 2022-07-22 DIAGNOSIS — R9431 Abnormal electrocardiogram [ECG] [EKG]: Secondary | ICD-10-CM | POA: Diagnosis not present

## 2022-07-22 DIAGNOSIS — F11121 Opioid abuse with intoxication delirium: Secondary | ICD-10-CM | POA: Diagnosis not present

## 2022-07-22 DIAGNOSIS — F3340 Major depressive disorder, recurrent, in remission, unspecified: Secondary | ICD-10-CM | POA: Diagnosis not present

## 2022-07-22 DIAGNOSIS — R4182 Altered mental status, unspecified: Secondary | ICD-10-CM | POA: Diagnosis not present

## 2022-07-22 DIAGNOSIS — E1122 Type 2 diabetes mellitus with diabetic chronic kidney disease: Secondary | ICD-10-CM | POA: Diagnosis not present

## 2022-07-22 DIAGNOSIS — N178 Other acute kidney failure: Secondary | ICD-10-CM | POA: Diagnosis not present

## 2022-07-22 DIAGNOSIS — Z79891 Long term (current) use of opiate analgesic: Secondary | ICD-10-CM | POA: Diagnosis not present

## 2022-07-22 DIAGNOSIS — N183 Chronic kidney disease, stage 3 unspecified: Secondary | ICD-10-CM | POA: Diagnosis not present

## 2022-07-22 DIAGNOSIS — Z20822 Contact with and (suspected) exposure to covid-19: Secondary | ICD-10-CM | POA: Diagnosis not present

## 2022-07-22 DIAGNOSIS — R54 Age-related physical debility: Secondary | ICD-10-CM | POA: Diagnosis not present

## 2022-07-22 DIAGNOSIS — R0902 Hypoxemia: Secondary | ICD-10-CM | POA: Diagnosis not present

## 2022-07-22 DIAGNOSIS — N179 Acute kidney failure, unspecified: Secondary | ICD-10-CM | POA: Diagnosis not present

## 2022-07-22 DIAGNOSIS — G9341 Metabolic encephalopathy: Secondary | ICD-10-CM | POA: Diagnosis not present

## 2022-07-22 DIAGNOSIS — I5030 Unspecified diastolic (congestive) heart failure: Secondary | ICD-10-CM | POA: Diagnosis not present

## 2022-07-22 DIAGNOSIS — D631 Anemia in chronic kidney disease: Secondary | ICD-10-CM | POA: Diagnosis not present

## 2022-07-22 DIAGNOSIS — F11921 Opioid use, unspecified with intoxication delirium: Secondary | ICD-10-CM | POA: Diagnosis not present

## 2022-07-23 DIAGNOSIS — N179 Acute kidney failure, unspecified: Secondary | ICD-10-CM | POA: Diagnosis not present

## 2022-07-23 DIAGNOSIS — R4182 Altered mental status, unspecified: Secondary | ICD-10-CM | POA: Diagnosis not present

## 2022-07-23 DIAGNOSIS — R7401 Elevation of levels of liver transaminase levels: Secondary | ICD-10-CM | POA: Diagnosis not present

## 2022-07-23 DIAGNOSIS — N183 Chronic kidney disease, stage 3 unspecified: Secondary | ICD-10-CM | POA: Diagnosis not present

## 2022-07-23 DIAGNOSIS — I509 Heart failure, unspecified: Secondary | ICD-10-CM | POA: Diagnosis not present

## 2022-07-24 DIAGNOSIS — R54 Age-related physical debility: Secondary | ICD-10-CM | POA: Diagnosis not present

## 2022-07-24 DIAGNOSIS — Z79891 Long term (current) use of opiate analgesic: Secondary | ICD-10-CM | POA: Diagnosis not present

## 2022-07-24 DIAGNOSIS — N183 Chronic kidney disease, stage 3 unspecified: Secondary | ICD-10-CM | POA: Diagnosis not present

## 2022-07-24 DIAGNOSIS — R4182 Altered mental status, unspecified: Secondary | ICD-10-CM | POA: Diagnosis not present

## 2022-07-24 DIAGNOSIS — N178 Other acute kidney failure: Secondary | ICD-10-CM | POA: Diagnosis not present

## 2022-07-31 DIAGNOSIS — F5104 Psychophysiologic insomnia: Secondary | ICD-10-CM | POA: Diagnosis not present

## 2022-07-31 DIAGNOSIS — I5030 Unspecified diastolic (congestive) heart failure: Secondary | ICD-10-CM | POA: Diagnosis not present

## 2022-07-31 DIAGNOSIS — Z96651 Presence of right artificial knee joint: Secondary | ICD-10-CM | POA: Diagnosis not present

## 2022-07-31 DIAGNOSIS — M1A30X0 Chronic gout due to renal impairment, unspecified site, without tophus (tophi): Secondary | ICD-10-CM | POA: Diagnosis not present

## 2022-07-31 DIAGNOSIS — Z09 Encounter for follow-up examination after completed treatment for conditions other than malignant neoplasm: Secondary | ICD-10-CM | POA: Diagnosis not present

## 2022-07-31 DIAGNOSIS — N1832 Chronic kidney disease, stage 3b: Secondary | ICD-10-CM | POA: Diagnosis not present

## 2022-07-31 DIAGNOSIS — D631 Anemia in chronic kidney disease: Secondary | ICD-10-CM | POA: Diagnosis not present

## 2022-07-31 DIAGNOSIS — E559 Vitamin D deficiency, unspecified: Secondary | ICD-10-CM | POA: Diagnosis not present

## 2022-07-31 DIAGNOSIS — E1159 Type 2 diabetes mellitus with other circulatory complications: Secondary | ICD-10-CM | POA: Diagnosis not present

## 2022-08-01 DIAGNOSIS — Z96651 Presence of right artificial knee joint: Secondary | ICD-10-CM | POA: Diagnosis not present

## 2022-08-15 ENCOUNTER — Telehealth: Payer: Self-pay | Admitting: *Deleted

## 2022-08-15 DIAGNOSIS — N1832 Chronic kidney disease, stage 3b: Secondary | ICD-10-CM | POA: Diagnosis not present

## 2022-08-15 DIAGNOSIS — I10 Essential (primary) hypertension: Secondary | ICD-10-CM | POA: Diagnosis not present

## 2022-08-15 DIAGNOSIS — N1831 Chronic kidney disease, stage 3a: Secondary | ICD-10-CM | POA: Diagnosis not present

## 2022-08-15 DIAGNOSIS — N179 Acute kidney failure, unspecified: Secondary | ICD-10-CM | POA: Diagnosis not present

## 2022-08-15 NOTE — Telephone Encounter (Signed)
CAll returned to Peggy Phillips and informed that no patient dose not need to have labs repeated

## 2022-08-15 NOTE — Telephone Encounter (Signed)
Patient daughter called asking if patient needs to come in prior to her infusion 08/21/22 for lab draw , Her last lab check and Iron infusion was in February . Please advise

## 2022-08-15 NOTE — Telephone Encounter (Signed)
Infusions were spread out per pt request, due to surgery in Feb. She should keep infusion appt and appts in May as scheduled.

## 2022-08-21 ENCOUNTER — Inpatient Hospital Stay: Payer: Medicare PPO | Attending: Oncology

## 2022-08-21 VITALS — BP 106/39 | HR 54 | Temp 97.7°F

## 2022-08-21 DIAGNOSIS — D509 Iron deficiency anemia, unspecified: Secondary | ICD-10-CM | POA: Insufficient documentation

## 2022-08-21 DIAGNOSIS — D649 Anemia, unspecified: Secondary | ICD-10-CM

## 2022-08-21 DIAGNOSIS — N183 Chronic kidney disease, stage 3 unspecified: Secondary | ICD-10-CM | POA: Insufficient documentation

## 2022-08-21 DIAGNOSIS — I129 Hypertensive chronic kidney disease with stage 1 through stage 4 chronic kidney disease, or unspecified chronic kidney disease: Secondary | ICD-10-CM | POA: Insufficient documentation

## 2022-08-21 MED ORDER — SODIUM CHLORIDE 0.9 % IV SOLN: 4.0000 mg | Freq: Once | INTRAVENOUS | Status: DC

## 2022-08-21 MED ORDER — DIPHENHYDRAMINE HCL 50 MG/ML IJ SOLN
25.0000 mg | Freq: Once | INTRAMUSCULAR | Status: AC
  Administered 2022-08-21: 25 mg via INTRAVENOUS
  Filled 2022-08-21: qty 1

## 2022-08-21 MED ORDER — SODIUM CHLORIDE 0.9 % IV SOLN
200.0000 mg | Freq: Once | INTRAVENOUS | Status: AC
  Administered 2022-08-21: 200 mg via INTRAVENOUS
  Filled 2022-08-21: qty 200

## 2022-08-21 MED ORDER — SODIUM CHLORIDE 0.9 % IV SOLN
Freq: Once | INTRAVENOUS | Status: AC
  Filled 2022-08-21: qty 250

## 2022-08-21 MED ORDER — ONDANSETRON HCL 4 MG/2ML IJ SOLN
4.0000 mg | Freq: Once | INTRAMUSCULAR | Status: AC
  Administered 2022-08-21: 4 mg via INTRAVENOUS
  Filled 2022-08-21: qty 2

## 2022-08-21 MED ORDER — DEXAMETHASONE SODIUM PHOSPHATE 10 MG/ML IJ SOLN
4.0000 mg | Freq: Once | INTRAMUSCULAR | Status: AC
  Administered 2022-08-21: 4 mg via INTRAVENOUS
  Filled 2022-08-21: qty 1

## 2022-09-04 DIAGNOSIS — I10 Essential (primary) hypertension: Secondary | ICD-10-CM | POA: Diagnosis not present

## 2022-09-04 DIAGNOSIS — I5032 Chronic diastolic (congestive) heart failure: Secondary | ICD-10-CM | POA: Diagnosis not present

## 2022-09-04 DIAGNOSIS — E785 Hyperlipidemia, unspecified: Secondary | ICD-10-CM | POA: Diagnosis not present

## 2022-09-13 DIAGNOSIS — R52 Pain, unspecified: Secondary | ICD-10-CM | POA: Diagnosis not present

## 2022-09-13 DIAGNOSIS — M792 Neuralgia and neuritis, unspecified: Secondary | ICD-10-CM | POA: Diagnosis not present

## 2022-09-13 DIAGNOSIS — Z79891 Long term (current) use of opiate analgesic: Secondary | ICD-10-CM | POA: Diagnosis not present

## 2022-09-13 DIAGNOSIS — M5416 Radiculopathy, lumbar region: Secondary | ICD-10-CM | POA: Diagnosis not present

## 2022-09-13 DIAGNOSIS — M5412 Radiculopathy, cervical region: Secondary | ICD-10-CM | POA: Diagnosis not present

## 2022-09-13 DIAGNOSIS — R202 Paresthesia of skin: Secondary | ICD-10-CM | POA: Diagnosis not present

## 2022-09-13 DIAGNOSIS — M1711 Unilateral primary osteoarthritis, right knee: Secondary | ICD-10-CM | POA: Diagnosis not present

## 2022-09-13 DIAGNOSIS — M25551 Pain in right hip: Secondary | ICD-10-CM | POA: Diagnosis not present

## 2022-09-13 DIAGNOSIS — M545 Low back pain, unspecified: Secondary | ICD-10-CM | POA: Diagnosis not present

## 2022-10-06 ENCOUNTER — Inpatient Hospital Stay: Payer: Medicare PPO | Attending: Oncology

## 2022-10-09 MED FILL — Iron Sucrose Inj 20 MG/ML (Fe Equiv): INTRAVENOUS | Qty: 10 | Status: AC

## 2022-10-10 ENCOUNTER — Inpatient Hospital Stay: Payer: Medicare PPO

## 2022-10-10 ENCOUNTER — Inpatient Hospital Stay: Payer: Medicare PPO | Admitting: Oncology

## 2022-11-22 DIAGNOSIS — M25551 Pain in right hip: Secondary | ICD-10-CM | POA: Diagnosis not present

## 2022-11-22 DIAGNOSIS — M5412 Radiculopathy, cervical region: Secondary | ICD-10-CM | POA: Diagnosis not present

## 2022-11-22 DIAGNOSIS — Z79899 Other long term (current) drug therapy: Secondary | ICD-10-CM | POA: Diagnosis not present

## 2022-11-22 DIAGNOSIS — M792 Neuralgia and neuritis, unspecified: Secondary | ICD-10-CM | POA: Diagnosis not present

## 2022-11-22 DIAGNOSIS — Z5181 Encounter for therapeutic drug level monitoring: Secondary | ICD-10-CM | POA: Diagnosis not present

## 2022-11-22 DIAGNOSIS — M545 Low back pain, unspecified: Secondary | ICD-10-CM | POA: Diagnosis not present

## 2022-11-22 DIAGNOSIS — M542 Cervicalgia: Secondary | ICD-10-CM | POA: Diagnosis not present

## 2022-11-22 DIAGNOSIS — R52 Pain, unspecified: Secondary | ICD-10-CM | POA: Diagnosis not present

## 2022-11-22 DIAGNOSIS — G894 Chronic pain syndrome: Secondary | ICD-10-CM | POA: Diagnosis not present

## 2022-11-22 DIAGNOSIS — M5416 Radiculopathy, lumbar region: Secondary | ICD-10-CM | POA: Diagnosis not present

## 2022-11-22 DIAGNOSIS — Z79891 Long term (current) use of opiate analgesic: Secondary | ICD-10-CM | POA: Diagnosis not present

## 2022-11-29 DIAGNOSIS — M25512 Pain in left shoulder: Secondary | ICD-10-CM | POA: Diagnosis not present

## 2022-12-18 DIAGNOSIS — N1832 Chronic kidney disease, stage 3b: Secondary | ICD-10-CM | POA: Diagnosis not present

## 2022-12-18 DIAGNOSIS — I10 Essential (primary) hypertension: Secondary | ICD-10-CM | POA: Diagnosis not present

## 2022-12-18 DIAGNOSIS — D631 Anemia in chronic kidney disease: Secondary | ICD-10-CM | POA: Diagnosis not present

## 2023-01-25 DIAGNOSIS — E1122 Type 2 diabetes mellitus with diabetic chronic kidney disease: Secondary | ICD-10-CM | POA: Diagnosis not present

## 2023-01-25 DIAGNOSIS — E559 Vitamin D deficiency, unspecified: Secondary | ICD-10-CM | POA: Diagnosis not present

## 2023-01-25 DIAGNOSIS — F5104 Psychophysiologic insomnia: Secondary | ICD-10-CM | POA: Diagnosis not present

## 2023-01-25 DIAGNOSIS — Z09 Encounter for follow-up examination after completed treatment for conditions other than malignant neoplasm: Secondary | ICD-10-CM | POA: Diagnosis not present

## 2023-01-25 DIAGNOSIS — E1159 Type 2 diabetes mellitus with other circulatory complications: Secondary | ICD-10-CM | POA: Diagnosis not present

## 2023-01-25 DIAGNOSIS — J302 Other seasonal allergic rhinitis: Secondary | ICD-10-CM | POA: Diagnosis not present

## 2023-01-25 DIAGNOSIS — I5030 Unspecified diastolic (congestive) heart failure: Secondary | ICD-10-CM | POA: Diagnosis not present

## 2023-01-25 DIAGNOSIS — K219 Gastro-esophageal reflux disease without esophagitis: Secondary | ICD-10-CM | POA: Diagnosis not present

## 2023-01-25 DIAGNOSIS — E039 Hypothyroidism, unspecified: Secondary | ICD-10-CM | POA: Diagnosis not present

## 2023-01-25 DIAGNOSIS — N183 Chronic kidney disease, stage 3 unspecified: Secondary | ICD-10-CM | POA: Diagnosis not present

## 2023-01-25 DIAGNOSIS — I152 Hypertension secondary to endocrine disorders: Secondary | ICD-10-CM | POA: Diagnosis not present

## 2023-02-21 DIAGNOSIS — M542 Cervicalgia: Secondary | ICD-10-CM | POA: Diagnosis not present

## 2023-02-21 DIAGNOSIS — M792 Neuralgia and neuritis, unspecified: Secondary | ICD-10-CM | POA: Diagnosis not present

## 2023-02-21 DIAGNOSIS — R52 Pain, unspecified: Secondary | ICD-10-CM | POA: Diagnosis not present

## 2023-02-21 DIAGNOSIS — Z79891 Long term (current) use of opiate analgesic: Secondary | ICD-10-CM | POA: Diagnosis not present

## 2023-02-21 DIAGNOSIS — M25551 Pain in right hip: Secondary | ICD-10-CM | POA: Diagnosis not present

## 2023-02-21 DIAGNOSIS — M5412 Radiculopathy, cervical region: Secondary | ICD-10-CM | POA: Diagnosis not present

## 2023-02-21 DIAGNOSIS — G894 Chronic pain syndrome: Secondary | ICD-10-CM | POA: Diagnosis not present

## 2023-02-21 DIAGNOSIS — M5416 Radiculopathy, lumbar region: Secondary | ICD-10-CM | POA: Diagnosis not present

## 2023-02-21 DIAGNOSIS — M545 Low back pain, unspecified: Secondary | ICD-10-CM | POA: Diagnosis not present

## 2023-02-26 DIAGNOSIS — R399 Unspecified symptoms and signs involving the genitourinary system: Secondary | ICD-10-CM | POA: Diagnosis not present

## 2023-03-19 DIAGNOSIS — E039 Hypothyroidism, unspecified: Secondary | ICD-10-CM | POA: Diagnosis not present

## 2023-04-18 DIAGNOSIS — D631 Anemia in chronic kidney disease: Secondary | ICD-10-CM | POA: Diagnosis not present

## 2023-04-18 DIAGNOSIS — I10 Essential (primary) hypertension: Secondary | ICD-10-CM | POA: Diagnosis not present

## 2023-04-18 DIAGNOSIS — N1832 Chronic kidney disease, stage 3b: Secondary | ICD-10-CM | POA: Diagnosis not present

## 2023-05-23 DIAGNOSIS — Z79891 Long term (current) use of opiate analgesic: Secondary | ICD-10-CM | POA: Diagnosis not present

## 2023-05-23 DIAGNOSIS — M5416 Radiculopathy, lumbar region: Secondary | ICD-10-CM | POA: Diagnosis not present

## 2023-05-23 DIAGNOSIS — M25551 Pain in right hip: Secondary | ICD-10-CM | POA: Diagnosis not present

## 2023-05-23 DIAGNOSIS — G894 Chronic pain syndrome: Secondary | ICD-10-CM | POA: Diagnosis not present

## 2023-05-23 DIAGNOSIS — M792 Neuralgia and neuritis, unspecified: Secondary | ICD-10-CM | POA: Diagnosis not present

## 2023-05-23 DIAGNOSIS — M47896 Other spondylosis, lumbar region: Secondary | ICD-10-CM | POA: Diagnosis not present

## 2023-05-23 DIAGNOSIS — M5412 Radiculopathy, cervical region: Secondary | ICD-10-CM | POA: Diagnosis not present

## 2023-05-23 DIAGNOSIS — M542 Cervicalgia: Secondary | ICD-10-CM | POA: Diagnosis not present

## 2023-05-31 DIAGNOSIS — E1159 Type 2 diabetes mellitus with other circulatory complications: Secondary | ICD-10-CM | POA: Diagnosis not present

## 2023-05-31 DIAGNOSIS — I152 Hypertension secondary to endocrine disorders: Secondary | ICD-10-CM | POA: Diagnosis not present

## 2023-06-06 DIAGNOSIS — M5416 Radiculopathy, lumbar region: Secondary | ICD-10-CM | POA: Diagnosis not present

## 2023-06-07 DIAGNOSIS — E785 Hyperlipidemia, unspecified: Secondary | ICD-10-CM | POA: Diagnosis not present

## 2023-06-07 DIAGNOSIS — I110 Hypertensive heart disease with heart failure: Secondary | ICD-10-CM | POA: Diagnosis not present

## 2023-06-07 DIAGNOSIS — F419 Anxiety disorder, unspecified: Secondary | ICD-10-CM | POA: Diagnosis not present

## 2023-06-07 DIAGNOSIS — I5032 Chronic diastolic (congestive) heart failure: Secondary | ICD-10-CM | POA: Diagnosis not present

## 2023-06-07 DIAGNOSIS — Z0189 Encounter for other specified special examinations: Secondary | ICD-10-CM | POA: Diagnosis not present

## 2023-06-15 DIAGNOSIS — E785 Hyperlipidemia, unspecified: Secondary | ICD-10-CM | POA: Diagnosis not present

## 2023-06-19 DIAGNOSIS — I10 Essential (primary) hypertension: Secondary | ICD-10-CM | POA: Diagnosis not present

## 2023-06-19 DIAGNOSIS — F419 Anxiety disorder, unspecified: Secondary | ICD-10-CM | POA: Diagnosis not present

## 2023-06-20 DIAGNOSIS — Z79891 Long term (current) use of opiate analgesic: Secondary | ICD-10-CM | POA: Diagnosis not present

## 2023-06-20 DIAGNOSIS — M25551 Pain in right hip: Secondary | ICD-10-CM | POA: Diagnosis not present

## 2023-06-20 DIAGNOSIS — G894 Chronic pain syndrome: Secondary | ICD-10-CM | POA: Diagnosis not present

## 2023-06-20 DIAGNOSIS — M792 Neuralgia and neuritis, unspecified: Secondary | ICD-10-CM | POA: Diagnosis not present

## 2023-06-20 DIAGNOSIS — M47896 Other spondylosis, lumbar region: Secondary | ICD-10-CM | POA: Diagnosis not present

## 2023-06-20 DIAGNOSIS — M5416 Radiculopathy, lumbar region: Secondary | ICD-10-CM | POA: Diagnosis not present

## 2023-06-20 DIAGNOSIS — M5412 Radiculopathy, cervical region: Secondary | ICD-10-CM | POA: Diagnosis not present

## 2023-06-20 DIAGNOSIS — M542 Cervicalgia: Secondary | ICD-10-CM | POA: Diagnosis not present

## 2023-06-22 DIAGNOSIS — Z0100 Encounter for examination of eyes and vision without abnormal findings: Secondary | ICD-10-CM | POA: Diagnosis not present

## 2023-06-22 DIAGNOSIS — H2513 Age-related nuclear cataract, bilateral: Secondary | ICD-10-CM | POA: Diagnosis not present

## 2023-06-25 DIAGNOSIS — D2262 Melanocytic nevi of left upper limb, including shoulder: Secondary | ICD-10-CM | POA: Diagnosis not present

## 2023-06-25 DIAGNOSIS — L821 Other seborrheic keratosis: Secondary | ICD-10-CM | POA: Diagnosis not present

## 2023-06-25 DIAGNOSIS — D2271 Melanocytic nevi of right lower limb, including hip: Secondary | ICD-10-CM | POA: Diagnosis not present

## 2023-06-25 DIAGNOSIS — D2272 Melanocytic nevi of left lower limb, including hip: Secondary | ICD-10-CM | POA: Diagnosis not present

## 2023-06-25 DIAGNOSIS — D2261 Melanocytic nevi of right upper limb, including shoulder: Secondary | ICD-10-CM | POA: Diagnosis not present

## 2023-06-25 DIAGNOSIS — D225 Melanocytic nevi of trunk: Secondary | ICD-10-CM | POA: Diagnosis not present

## 2023-06-25 DIAGNOSIS — L570 Actinic keratosis: Secondary | ICD-10-CM | POA: Diagnosis not present

## 2023-06-29 ENCOUNTER — Other Ambulatory Visit: Payer: Self-pay

## 2023-06-29 ENCOUNTER — Inpatient Hospital Stay
Admission: RE | Admit: 2023-06-29 | Discharge: 2023-06-29 | Disposition: A | Discharge status: Home / Self Care | Payer: Self-pay | Source: Ambulatory Visit | Attending: Neurosurgery | Admitting: Neurosurgery

## 2023-06-29 DIAGNOSIS — Z049 Encounter for examination and observation for unspecified reason: Secondary | ICD-10-CM

## 2023-07-09 NOTE — Progress Notes (Unsigned)
 Referring Physician:  Domingo Mend [address] [place],  [place] [address]  Primary Physician:  Luciana Axe, NP  History of Present Illness: 07/10/2023 [name] is here today with a chief complaint of longstanding back pain with a recent exacerbation in Christmas 2024.  She was doing some baking and lifted something heavy.  She had a worsening the pain for about a month, but then it improved.  She is now back at her baseline amount of back pain.  During her workup, an MRI was performed.  This showed a possible lesion at L2.  She was referred here for management recommendations.  Conservative measures:  Physical therapy: has not participated in for her back recently  Multimodal medical therapy including regular antiinflammatories: hydromorphone, celebrex, medrol dosepak, meloxicam, lidocaine patches, gabapentin,  Injections: no epidural steroid injections since the new pain started  Past Surgery:  Lumbar Surgery x 3   Peggy Phillips has no symptoms of cervical myelopathy.  The symptoms are causing a significant impact on the patient's life.   I have utilized the care everywhere function in epic to review the outside records available from external health systems.  Review of Systems:  A 10 point review of systems is negative, except for the pertinent positives and negatives detailed in the HPI.  Past Medical History: Past Medical History:  Diagnosis Date   High cholesterol    Hypertension    Thyroid disease     Past Surgical History: Past Surgical History:  Procedure Laterality Date   ABDOMINAL HYSTERECTOMY     APPENDECTOMY     BREAST BIOPSY Left 07/27/2004   neg   BREAST SURGERY     GALLBLADDER SURGERY     herniated disc      Allergies: Allergies as of 07/10/2023 - Review Complete 07/10/2023  Allergen Reaction Noted   Itraconazole Hives 12/26/2010   Hydrocodone Itching 09/17/2019   Naloxegol Diarrhea and Nausea And Vomiting 10/16/2019    Oxycodone Itching 09/17/2019   Atorvastatin Other (See Comments) 03/04/2014   Celecoxib  06/07/2023   Codeine sulfate Other (See Comments) 03/04/2014   Hydralazine Itching 09/12/2019   Meloxicam Diarrhea 03/04/2014   Other Hives 03/04/2014   Simvastatin  03/04/2014   Sulfa antibiotics Other (See Comments) 03/04/2014   Sulfasalazine Other (See Comments) 03/04/2014   Trazodone Other (See Comments) 08/03/2022   Doxepin  01/27/2018    Medications:  Current Outpatient Medications:    amLODipine (NORVASC) 5 MG tablet, Take 1 tablet by mouth daily., Disp: , Rfl:    ammonium lactate (LAC-HYDRIN) 12 % lotion, Apply 1 application topically as needed. , Disp: , Rfl:    Cyanocobalamin (VITAMIN B 12) 500 MCG TABS, Take 500 mcg by mouth daily. , Disp: , Rfl:    diclofenac sodium (VOLTAREN) 1 % GEL, Apply 2 g topically as needed. , Disp: , Rfl:    Docusate Sodium 100 MG capsule, Take 200 mg by mouth daily. , Disp: , Rfl:    ezetimibe (ZETIA) 10 MG tablet, Take by mouth., Disp: , Rfl:    fluticasone (FLONASE) 50 MCG/ACT nasal spray, Place 2 sprays into both nostrils daily. , Disp: , Rfl:    HYDROmorphone (DILAUDID) 4 MG tablet, Take 4 mg by mouth every 6 (six) hours as needed. , Disp: , Rfl:    levothyroxine (SYNTHROID) 112 MCG tablet, Take by mouth., Disp: , Rfl:    lisinopril (ZESTRIL) 40 MG tablet, Take 40 mg by mouth daily. , Disp: , Rfl:  naloxone (NARCAN) 4 MG/0.1ML LIQD nasal spray kit, , Disp: , Rfl:    ondansetron (ZOFRAN) 4 MG tablet, Take by mouth., Disp: , Rfl:    polycarbophil (FIBERCON) 625 MG tablet, Take 625 mg by mouth daily. , Disp: , Rfl:    Vitamin D, Ergocalciferol, (DRISDOL) 50000 UNITS CAPS capsule, Take 50,000 Units by mouth every 7 (seven) days. , Disp: , Rfl:    Wheat Dextrin (BENEFIBER PO), Take by mouth daily., Disp: , Rfl:    ZETIA 10 MG tablet, Take 10 mg by mouth daily., Disp: , Rfl:   Social History: Social History   Tobacco Use   Smoking status: Never    Smokeless tobacco: Never  Vaping Use   Vaping status: Never Used  Substance Use Topics   Drug use: Never    Family Medical History: Family History  Problem Relation Age of Onset   Heart disease Mother    Heart disease Other    Breast cancer Neg Hx     Physical Examination: Vitals:   07/10/23 1410  BP: 138/78    General: Patient is in no apparent distress. Attention to examination is appropriate.  Neck:   Supple.  Full range of motion.  Respiratory: Patient is breathing without any difficulty.   NEUROLOGICAL:     Awake, alert, oriented to person, place, and time.  Speech is clear and fluent.   Cranial Nerves: Pupils equal round and reactive to light.  Facial tone is symmetric.  Facial sensation is symmetric. Shoulder shrug is symmetric. Tongue protrusion is midline.  There is no pronator drift.  Strength: Side Biceps Triceps Deltoid Interossei Grip Wrist Ext. Wrist Flex.  R 5 5 5 5 5 5 5   L 5 5 5 5 5 5 5    Side Iliopsoas Quads Hamstring PF DF EHL  R 5 5 5 5 5 5   L 5 5 5 5 5 5    Reflexes are 1+ and symmetric at the biceps, triceps, brachioradialis, patella and achilles.   Hoffman's is absent.   Bilateral upper and lower extremity sensation is intact to light touch.    No evidence of dysmetria noted.  Gait is normal.     Medical Decision Making  Imaging: MRI L spine 06/06/2023 Transitional lumbosacral anatomy.  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  Marrow edema in the bilateral L2 pedicles extending into the vertebral body.  This may represent stress reaction but cannot represent metastasis.  Consider follow-up MRI lumbar spine in 3 to 6 months.  I have personally reviewed the images and agree with the above interpretation.  Assessment and Plan: Peggy Phillips is a pleasant 83 y.o. female with chronic low back pain.  On her recent MRI scan, she has a lesion at the L2 vertebral body.  This is read as a possible metastasis.  I think it is unlikely that this  represents a metastasis.  I think is more likely that she injured her back in December and then had residual inflammation, or that she has an atypical hemangioma.  We will get an MRI scan in approximately 3 months.  If this is stable or better, we will not need to do any more evaluation.  I spent a total of 30 minutes in this patient's care today. This time was spent reviewing pertinent records including imaging studies, obtaining and confirming history, performing a directed evaluation, formulating and discussing my recommendations, and documenting the visit within the medical record.      Thank you for involving  me in the care of this patient.      Tiaria Biby K. Myer Haff MD, [facility]surgery

## 2023-07-10 ENCOUNTER — Ambulatory Visit: Payer: Medicare PPO | Admitting: Neurosurgery

## 2023-07-10 VITALS — BP 138/78 | Ht 64.0 in | Wt 180.2 lb

## 2023-07-10 DIAGNOSIS — G8929 Other chronic pain: Secondary | ICD-10-CM | POA: Diagnosis not present

## 2023-07-10 DIAGNOSIS — M545 Low back pain, unspecified: Secondary | ICD-10-CM

## 2023-07-10 DIAGNOSIS — G959 Disease of spinal cord, unspecified: Secondary | ICD-10-CM

## 2023-07-10 DIAGNOSIS — D1809 Hemangioma of other sites: Secondary | ICD-10-CM

## 2023-07-16 DIAGNOSIS — Z1331 Encounter for screening for depression: Secondary | ICD-10-CM | POA: Diagnosis not present

## 2023-07-16 DIAGNOSIS — D631 Anemia in chronic kidney disease: Secondary | ICD-10-CM | POA: Diagnosis not present

## 2023-07-16 DIAGNOSIS — N1832 Chronic kidney disease, stage 3b: Secondary | ICD-10-CM | POA: Diagnosis not present

## 2023-07-16 DIAGNOSIS — M8589 Other specified disorders of bone density and structure, multiple sites: Secondary | ICD-10-CM | POA: Diagnosis not present

## 2023-07-16 DIAGNOSIS — E1159 Type 2 diabetes mellitus with other circulatory complications: Secondary | ICD-10-CM | POA: Diagnosis not present

## 2023-07-16 DIAGNOSIS — Z0000 Encounter for general adult medical examination without abnormal findings: Secondary | ICD-10-CM | POA: Diagnosis not present

## 2023-07-16 DIAGNOSIS — E559 Vitamin D deficiency, unspecified: Secondary | ICD-10-CM | POA: Diagnosis not present

## 2023-07-16 DIAGNOSIS — I152 Hypertension secondary to endocrine disorders: Secondary | ICD-10-CM | POA: Diagnosis not present

## 2023-07-16 DIAGNOSIS — I5030 Unspecified diastolic (congestive) heart failure: Secondary | ICD-10-CM | POA: Diagnosis not present

## 2023-07-16 DIAGNOSIS — E039 Hypothyroidism, unspecified: Secondary | ICD-10-CM | POA: Diagnosis not present

## 2023-07-16 DIAGNOSIS — E1169 Type 2 diabetes mellitus with other specified complication: Secondary | ICD-10-CM | POA: Diagnosis not present

## 2023-07-16 DIAGNOSIS — E1122 Type 2 diabetes mellitus with diabetic chronic kidney disease: Secondary | ICD-10-CM | POA: Diagnosis not present

## 2023-07-16 DIAGNOSIS — E538 Deficiency of other specified B group vitamins: Secondary | ICD-10-CM | POA: Diagnosis not present

## 2023-07-17 DIAGNOSIS — E1122 Type 2 diabetes mellitus with diabetic chronic kidney disease: Secondary | ICD-10-CM | POA: Diagnosis not present

## 2023-07-17 DIAGNOSIS — E1169 Type 2 diabetes mellitus with other specified complication: Secondary | ICD-10-CM | POA: Diagnosis not present

## 2023-07-17 DIAGNOSIS — E039 Hypothyroidism, unspecified: Secondary | ICD-10-CM | POA: Diagnosis not present

## 2023-07-17 DIAGNOSIS — E559 Vitamin D deficiency, unspecified: Secondary | ICD-10-CM | POA: Diagnosis not present

## 2023-07-17 DIAGNOSIS — M8589 Other specified disorders of bone density and structure, multiple sites: Secondary | ICD-10-CM | POA: Diagnosis not present

## 2023-07-17 DIAGNOSIS — E1159 Type 2 diabetes mellitus with other circulatory complications: Secondary | ICD-10-CM | POA: Diagnosis not present

## 2023-07-17 DIAGNOSIS — I5030 Unspecified diastolic (congestive) heart failure: Secondary | ICD-10-CM | POA: Diagnosis not present

## 2023-07-17 DIAGNOSIS — E538 Deficiency of other specified B group vitamins: Secondary | ICD-10-CM | POA: Diagnosis not present

## 2023-07-17 DIAGNOSIS — N1832 Chronic kidney disease, stage 3b: Secondary | ICD-10-CM | POA: Diagnosis not present

## 2023-07-17 DIAGNOSIS — M1A30X0 Chronic gout due to renal impairment, unspecified site, without tophus (tophi): Secondary | ICD-10-CM | POA: Diagnosis not present

## 2023-07-17 DIAGNOSIS — N183 Chronic kidney disease, stage 3 unspecified: Secondary | ICD-10-CM | POA: Diagnosis not present

## 2023-08-20 DIAGNOSIS — D631 Anemia in chronic kidney disease: Secondary | ICD-10-CM | POA: Diagnosis not present

## 2023-08-20 DIAGNOSIS — N1832 Chronic kidney disease, stage 3b: Secondary | ICD-10-CM | POA: Diagnosis not present

## 2023-08-20 DIAGNOSIS — I10 Essential (primary) hypertension: Secondary | ICD-10-CM | POA: Diagnosis not present

## 2023-10-03 DIAGNOSIS — M47896 Other spondylosis, lumbar region: Secondary | ICD-10-CM | POA: Diagnosis not present

## 2023-10-03 DIAGNOSIS — G894 Chronic pain syndrome: Secondary | ICD-10-CM | POA: Diagnosis not present

## 2023-10-03 DIAGNOSIS — Z79891 Long term (current) use of opiate analgesic: Secondary | ICD-10-CM | POA: Diagnosis not present

## 2023-10-03 DIAGNOSIS — M25551 Pain in right hip: Secondary | ICD-10-CM | POA: Diagnosis not present

## 2023-10-03 DIAGNOSIS — M5412 Radiculopathy, cervical region: Secondary | ICD-10-CM | POA: Diagnosis not present

## 2023-10-03 DIAGNOSIS — M542 Cervicalgia: Secondary | ICD-10-CM | POA: Diagnosis not present

## 2023-10-03 DIAGNOSIS — M792 Neuralgia and neuritis, unspecified: Secondary | ICD-10-CM | POA: Diagnosis not present

## 2023-10-03 DIAGNOSIS — M5416 Radiculopathy, lumbar region: Secondary | ICD-10-CM | POA: Diagnosis not present

## 2023-10-08 DIAGNOSIS — H2511 Age-related nuclear cataract, right eye: Secondary | ICD-10-CM | POA: Diagnosis not present

## 2023-10-08 DIAGNOSIS — H2513 Age-related nuclear cataract, bilateral: Secondary | ICD-10-CM | POA: Diagnosis not present

## 2023-10-17 DIAGNOSIS — M545 Low back pain, unspecified: Secondary | ICD-10-CM | POA: Diagnosis not present

## 2023-10-17 DIAGNOSIS — G8929 Other chronic pain: Secondary | ICD-10-CM | POA: Diagnosis not present

## 2023-10-23 ENCOUNTER — Encounter: Payer: Self-pay | Admitting: Ophthalmology

## 2023-10-26 ENCOUNTER — Other Ambulatory Visit: Payer: Self-pay | Admitting: Family Medicine

## 2023-10-26 ENCOUNTER — Inpatient Hospital Stay
Admission: RE | Admit: 2023-10-26 | Discharge: 2023-10-26 | Disposition: A | Discharge status: Home / Self Care | Payer: Self-pay | Source: Ambulatory Visit | Attending: Neurosurgery | Admitting: Neurosurgery

## 2023-10-26 DIAGNOSIS — Z049 Encounter for examination and observation for unspecified reason: Secondary | ICD-10-CM

## 2023-10-26 NOTE — Discharge Instructions (Signed)

## 2023-10-29 ENCOUNTER — Ambulatory Visit: Payer: Self-pay | Admitting: Anesthesiology

## 2023-10-29 ENCOUNTER — Ambulatory Visit
Admission: RE | Admit: 2023-10-29 | Discharge: 2023-10-29 | Disposition: A | Discharge status: Home / Self Care | Attending: Ophthalmology | Admitting: Ophthalmology

## 2023-10-29 ENCOUNTER — Encounter: Payer: Self-pay | Admitting: Ophthalmology

## 2023-10-29 ENCOUNTER — Other Ambulatory Visit: Payer: Self-pay

## 2023-10-29 ENCOUNTER — Encounter
Admission: RE | Disposition: A | Discharge status: Home / Self Care | Payer: Self-pay | Source: Home / Self Care | Attending: Ophthalmology

## 2023-10-29 DIAGNOSIS — Z7984 Long term (current) use of oral hypoglycemic drugs: Secondary | ICD-10-CM | POA: Insufficient documentation

## 2023-10-29 DIAGNOSIS — E1136 Type 2 diabetes mellitus with diabetic cataract: Secondary | ICD-10-CM | POA: Insufficient documentation

## 2023-10-29 DIAGNOSIS — N289 Disorder of kidney and ureter, unspecified: Secondary | ICD-10-CM | POA: Insufficient documentation

## 2023-10-29 DIAGNOSIS — I10 Essential (primary) hypertension: Secondary | ICD-10-CM | POA: Insufficient documentation

## 2023-10-29 DIAGNOSIS — Z8249 Family history of ischemic heart disease and other diseases of the circulatory system: Secondary | ICD-10-CM | POA: Diagnosis not present

## 2023-10-29 DIAGNOSIS — H2511 Age-related nuclear cataract, right eye: Secondary | ICD-10-CM | POA: Diagnosis not present

## 2023-10-29 DIAGNOSIS — E1122 Type 2 diabetes mellitus with diabetic chronic kidney disease: Secondary | ICD-10-CM | POA: Diagnosis not present

## 2023-10-29 DIAGNOSIS — N183 Chronic kidney disease, stage 3 unspecified: Secondary | ICD-10-CM | POA: Diagnosis not present

## 2023-10-29 DIAGNOSIS — K219 Gastro-esophageal reflux disease without esophagitis: Secondary | ICD-10-CM | POA: Diagnosis not present

## 2023-10-29 DIAGNOSIS — R011 Cardiac murmur, unspecified: Secondary | ICD-10-CM | POA: Insufficient documentation

## 2023-10-29 DIAGNOSIS — I129 Hypertensive chronic kidney disease with stage 1 through stage 4 chronic kidney disease, or unspecified chronic kidney disease: Secondary | ICD-10-CM | POA: Diagnosis not present

## 2023-10-29 HISTORY — PX: CATARACT EXTRACTION W/PHACO: SHX586

## 2023-10-29 HISTORY — DX: Disorder of kidney and ureter, unspecified: N28.9

## 2023-10-29 HISTORY — DX: Unspecified osteoarthritis, unspecified site: M19.90

## 2023-10-29 HISTORY — DX: Hypothyroidism, unspecified: E03.9

## 2023-10-29 HISTORY — DX: Cardiac murmur, unspecified: R01.1

## 2023-10-29 SURGERY — PHACOEMULSIFICATION, CATARACT, WITH IOL INSERTION
Anesthesia: Monitor Anesthesia Care | Site: Eye | Laterality: Right

## 2023-10-29 MED ORDER — DROPERIDOL 2.5 MG/ML IJ SOLN: 0.6250 mg | Freq: Once | INTRAMUSCULAR | Status: DC | PRN

## 2023-10-29 MED ORDER — SIGHTPATH DOSE#1 NA HYALUR & NA CHOND-NA HYALUR IO KIT
PACK | INTRAOCULAR | Status: DC | PRN
  Administered 2023-10-29: 1 via OPHTHALMIC

## 2023-10-29 MED ORDER — MIDAZOLAM HCL 2 MG/2ML IJ SOLN
INTRAMUSCULAR | Status: DC | PRN
  Administered 2023-10-29: 2 mg via INTRAVENOUS

## 2023-10-29 MED ORDER — OXYCODONE HCL 5 MG PO TABS: 5.0000 mg | ORAL_TABLET | Freq: Once | ORAL | Status: DC | PRN

## 2023-10-29 MED ORDER — FENTANYL CITRATE (PF) 100 MCG/2ML IJ SOLN
INTRAMUSCULAR | Status: DC | PRN
  Administered 2023-10-29: 100 ug via INTRAVENOUS

## 2023-10-29 MED ORDER — FENTANYL CITRATE PF 50 MCG/ML IJ SOSY: 25.0000 ug | PREFILLED_SYRINGE | INTRAMUSCULAR | Status: DC | PRN

## 2023-10-29 MED ORDER — ACETAMINOPHEN 10 MG/ML IV SOLN: 1000.0000 mg | Freq: Once | INTRAVENOUS | Status: DC | PRN

## 2023-10-29 MED ORDER — OXYCODONE HCL 5 MG/5ML PO SOLN: 5.0000 mg | Freq: Once | ORAL | Status: DC | PRN

## 2023-10-29 MED ORDER — MOXIFLOXACIN HCL 0.5 % OP SOLN
OPHTHALMIC | Status: DC | PRN
  Administered 2023-10-29: .2 mL via OPHTHALMIC

## 2023-10-29 MED ORDER — SIGHTPATH DOSE#1 BSS IO SOLN
INTRAOCULAR | Status: DC | PRN
  Administered 2023-10-29: 83 mL via OPHTHALMIC

## 2023-10-29 MED ORDER — ARMC OPHTHALMIC DILATING DROPS
OPHTHALMIC | Status: AC
  Filled 2023-10-29: qty 0.5

## 2023-10-29 MED ORDER — MIDAZOLAM HCL 2 MG/2ML IJ SOLN
INTRAMUSCULAR | Status: AC
  Filled 2023-10-29: qty 2

## 2023-10-29 MED ORDER — TETRACAINE HCL 0.5 % OP SOLN
1.0000 [drp] | OPHTHALMIC | Status: DC | PRN
  Administered 2023-10-29 (×3): 1 [drp] via OPHTHALMIC

## 2023-10-29 MED ORDER — FENTANYL CITRATE (PF) 100 MCG/2ML IJ SOLN
INTRAMUSCULAR | Status: AC
Start: 2023-10-29 — End: 2023-10-29
  Filled 2023-10-29: qty 2

## 2023-10-29 MED ORDER — SODIUM CHLORIDE 0.9% FLUSH
INTRAVENOUS | Status: DC | PRN
  Administered 2023-10-29: 10 mL via INTRAVENOUS

## 2023-10-29 MED ORDER — SIGHTPATH DOSE#1 BSS IO SOLN
INTRAOCULAR | Status: DC | PRN
Start: 2023-10-29 — End: 2023-10-29
  Administered 2023-10-29: 15 mL via INTRAOCULAR

## 2023-10-29 MED ORDER — LIDOCAINE HCL (PF) 2 % IJ SOLN
INTRAOCULAR | Status: DC | PRN
  Administered 2023-10-29: 4 mL via INTRAOCULAR

## 2023-10-29 MED ORDER — ARMC OPHTHALMIC DILATING DROPS
1.0000 | OPHTHALMIC | Status: DC | PRN
  Administered 2023-10-29 (×2): 1 via OPHTHALMIC

## 2023-10-29 MED ORDER — TETRACAINE HCL 0.5 % OP SOLN
OPHTHALMIC | Status: AC
  Filled 2023-10-29: qty 4

## 2023-10-29 MED ORDER — LACTATED RINGERS IV SOLN: INTRAVENOUS | Status: DC

## 2023-10-29 SURGICAL SUPPLY — 12 items
CATARACT SUITE SIGHTPATH (MISCELLANEOUS) ×1 IMPLANT
DISSECTOR HYDRO NUCLEUS 50X22 (MISCELLANEOUS) ×1 IMPLANT
FEE CATARACT SUITE SIGHTPATH (MISCELLANEOUS) ×1 IMPLANT
GLOVE PI ULTRA LF STRL 7.5 (GLOVE) ×1 IMPLANT
GLOVE SURG POLYISOPRENE 8.5 (GLOVE) ×1 IMPLANT
GLOVE SURG PROTEXIS BL SZ6.5 (GLOVE) ×1 IMPLANT
GLOVE SURG SYN 6.5 PF PI BL (GLOVE) ×1 IMPLANT
GLOVE SURG SYN 8.5 PF PI BL (GLOVE) ×1 IMPLANT
LENS IOL CLRN VT TRC 3 22.0 IMPLANT
NDL FILTER BLUNT 18X1 1/2 (NEEDLE) ×1 IMPLANT
NEEDLE FILTER BLUNT 18X1 1/2 (NEEDLE) ×1 IMPLANT
SYR 3ML LL SCALE MARK (SYRINGE) ×1 IMPLANT

## 2023-10-29 NOTE — H&P (Signed)
 [facility]   Primary Care Physician:  Steva Clotilda DEL, NP Ophthalmologist: Dr. Adine Novak  Pre-Procedure History & Physical: HPI:  Peggy Phillips is a 83 y.o. female here for cataract surgery.   Past Medical History:  Diagnosis Date   Arthritis    Heart murmur    High cholesterol    Hypertension    Hypothyroidism    Kidney disease    Thyroid  disease     Past Surgical History:  Procedure Laterality Date   ABDOMINAL HYSTERECTOMY     APPENDECTOMY     BREAST BIOPSY Left 07/27/2004   neg   BREAST SURGERY     GALLBLADDER SURGERY     herniated disc      Prior to Admission medications   Medication Sig Start Date End Date Taking? Authorizing Provider  amLODipine (NORVASC) 5 MG tablet Take 1 tablet by mouth daily. 06/07/23 06/06/24 Yes [provider]  Cyanocobalamin  (VITAMIN B 12) 500 MCG TABS Take 500 mcg by mouth daily.    Yes [provider]  Docusate Sodium 100 MG capsule Take 200 mg by mouth daily.    Yes [provider]  empagliflozin (JARDIANCE) 25 MG TABS tablet Take 25 mg by mouth daily. For Kidneys   Yes [provider]  ezetimibe (ZETIA) 10 MG tablet Take by mouth. 07/18/19  Yes [provider]  FLUoxetine (PROZAC) 40 MG capsule Take 40 mg by mouth daily.   Yes [provider]  fluticasone (FLONASE) 50 MCG/ACT nasal spray Place 2 sprays into both nostrils daily.  12/21/13  Yes [provider]  HYDROmorphone (DILAUDID) 4 MG tablet Take 4 mg by mouth 4 (four) times daily. 02/23/14  Yes [provider]  levothyroxine (SYNTHROID) 112 MCG tablet Take by mouth. 07/18/19  Yes [provider]  lisinopril (ZESTRIL) 40 MG tablet Take 40 mg by mouth daily.  12/18/16  Yes [provider]  ondansetron  (ZOFRAN ) 4 MG tablet Take by mouth every 8 (eight) hours as needed. 10/10/19  Yes [provider]  polycarbophil (FIBERCON) 625 MG tablet Take 625 mg by mouth daily.    Yes [provider]  Vitamin D, Ergocalciferol, (DRISDOL) 50000 UNITS CAPS capsule Take 50,000 Units by mouth every 7 (seven) days.  01/19/14  Yes [provider]  Wheat Dextrin (BENEFIBER PO) Take by mouth daily.   Yes [provider]  ammonium lactate (LAC-HYDRIN) 12 % lotion Apply 1 application topically as needed.  05/02/17   [provider]  diclofenac sodium (VOLTAREN) 1 % GEL Apply 2 g topically as needed.  12/20/18   [provider]  naloxone TAMMY) 4 MG/0.1ML LIQD nasal spray kit  07/22/18   [provider]  ZETIA 10 MG tablet Take 10 mg by mouth daily. Patient not taking: Reported on 10/23/2023 02/16/14   [provider]    Allergies as of 08/22/2023 - Review Complete 07/10/2023  Allergen Reaction Noted   Itraconazole Hives 12/26/2010   Hydrocodone Itching 09/17/2019   Naloxegol Diarrhea and Nausea And Vomiting 10/16/2019   Oxycodone Itching 09/17/2019   Atorvastatin Other (See Comments) 03/04/2014   Celecoxib  06/07/2023   Codeine sulfate Other (See Comments) 03/04/2014   Hydralazine Itching 09/12/2019   Meloxicam Diarrhea 03/04/2014   Other Hives 03/04/2014   Simvastatin  03/04/2014   Sulfa antibiotics Other (See Comments) 03/04/2014   Sulfasalazine Other (See Comments) 03/04/2014   Trazodone Other (See Comments) 08/03/2022   Doxepin  01/27/2018    Family  History  Problem Relation Age of Onset   Heart disease Mother    Heart disease Other    Breast cancer Neg Hx     Social History   Socioeconomic History   Marital status: Widowed    Spouse name: Not on file   Number of children: Not on file   Years of education: Not on file   Highest education level: Not on file  Occupational History   Not on file  Tobacco Use   Smoking status: Never   Smokeless tobacco: Never  Vaping Use   Vaping status: Never Used  Substance and Sexual Activity   Alcohol  use: Not on file   Drug use: Never   Sexual activity: Not  Currently  Other Topics Concern   Not on file  Social History Narrative   Not on file   Social Drivers of Health   Financial Resource Strain: Low Risk  (07/16/2023)   Received from [facility]   Overall Financial Resource Strain (CARDIA)    Difficulty of Paying Living Expenses: Not hard at all  Food Insecurity: No Food Insecurity (07/16/2023)   Received from [facility]   Hunger Vital Sign    Within the past 12 months, you worried that your food would run out before you got the money to buy more.: Never true    Within the past 12 months, the food you bought just didn't last and you didn't have money to get more.: Never true  Transportation Needs: No Transportation Needs (07/16/2023)   Received from [facility] - Transportation    In the past 12 months, has lack of transportation kept you from medical appointments or from getting medications?: No    Lack of Transportation (Non-Medical): No  Physical Activity: Inactive (06/08/2022)   Exercise Vital Sign    Days of Exercise per Week: 0 days    Minutes of Exercise per Session: 0 min  Stress: No Stress Concern Present (06/08/2022)   [employer] of Occupational Health - Occupational Stress Questionnaire    Feeling of Stress : Not at all  Social Connections: Moderately Integrated (06/08/2022)   Social Connection and Isolation Panel    Frequency of Communication with Friends and Family: More than three times a week    Frequency of Social Gatherings with Friends and Family: Once a week    Attends Religious Services: More than 4 times per year    Active Member of [employer] or Organizations: No    Attends Engineer, structural: More than 4 times per year    Marital Status: Widowed  Catering manager Violence: Not on file    Review of Systems: See HPI, otherwise negative ROS  Physical Exam: BP (!) 165/69   Temp 97.8 F (36.6 C) (Temporal)   Resp 12   Ht 5' 4.02  (1.626 m)   Wt 82.5 kg   SpO2 100%   BMI 31.21 kg/m  General:   Alert, cooperative. Head:  Normocephalic and atraumatic. Respiratory:  Normal work of breathing. Cardiovascular:  NAD  Impression/Plan: Peggy Phillips is here for cataract surgery.  Risks, benefits, limitations, and alternatives regarding cataract surgery have been reviewed with the patient.  Questions have been answered.  All parties agreeable.   Adine Novak, MD  10/29/2023, 11:56 AM

## 2023-10-29 NOTE — Op Note (Signed)
 OPERATIVE NOTE  Peggy Phillips [id number] 10/29/2023   PREOPERATIVE DIAGNOSIS:  Nuclear sclerotic cataract right eye.  H25.11   POSTOPERATIVE DIAGNOSIS:    Nuclear sclerotic cataract right eye.     PROCEDURE:  Phacoemusification with posterior chamber intraocular lens placement of the right eye   LENS:   Implant Name Type Inv. Item Serial No. Manufacturer Lot No. LRB No. Used Action  LENS IOL CLRN VT TRC 3 22.0 - [id number]  LENS IOL CLRN VT TRC 3 22.0 [id number] SIGHT[employer]  Right 1 Implanted       Procedure(s): PHACOEMULSIFICATION, CATARACT, WITH IOL INSERTION 5.78 00:45.2 (Right)  SURGEON:  Adine Novak, MD, MPH  ANESTHESIOLOGIST: Anesthesiologist: Myra Lynwood MATSU, MD CRNA: Jarvis Lew, CRNA   ANESTHESIA:  Topical with tetracaine drops augmented with 1% preservative-free intracameral lidocaine.  ESTIMATED BLOOD LOSS: less than 1 mL.   COMPLICATIONS:  None.   DESCRIPTION OF PROCEDURE:  The patient was identified in the holding room and transported to the operating room and placed in the supine position under the operating microscope.  The right eye was identified as the operative eye and it was prepped and draped in the usual sterile ophthalmic fashion.  The verion system was registered without difficulty.   A 1.0 millimeter clear-corneal paracentesis was made at the 10:30 position. 0.5 ml of preservative-free 1% lidocaine with epinephrine was injected into the anterior chamber.  The anterior chamber was filled with viscoelastic.  A 2.4 millimeter keratome was used to make a near-clear corneal incision at the 8:00 position.  A curvilinear capsulorrhexis was made with a cystotome and capsulorrhexis forceps.  Balanced salt solution was used to hydrodissect and hydrodelineate the nucleus.   Phacoemulsification was then used in stop and chop fashion to remove the lens nucleus and epinucleus.  The remaining cortex was then removed using the irrigation and aspiration handpiece.  Viscoelastic was then placed into the capsular bag to distend it for lens placement.  A lens was then injected into the capsular bag.  The remaining viscoelastic was aspirated.  The lens was rotated with guidance from the verion system.   Wounds were hydrated with balanced salt solution.  The anterior chamber was inflated to a physiologic pressure with balanced salt solution. The lens was well centered.  Intracameral vigamox 0.1 mL undiluted was injected into the eye and a drop placed onto the ocular surface.  No wound leaks were noted.  The patient was taken to the recovery room in stable condition without complications of anesthesia or surgery  Adine Novak 10/29/2023, 12:28 PM

## 2023-10-29 NOTE — Anesthesia Postprocedure Evaluation (Signed)
 Anesthesia Post Note  Patient: Peggy Phillips  Procedure(s) Performed: PHACOEMULSIFICATION, CATARACT, WITH IOL INSERTION 5.78 00:45.2 (Right: Eye)  Patient location during evaluation: PACU Anesthesia Type: MAC Level of consciousness: awake and alert Pain management: pain level controlled Vital Signs Assessment: post-procedure vital signs reviewed and stable Respiratory status: spontaneous breathing, nonlabored ventilation, respiratory function stable and patient connected to nasal cannula oxygen Cardiovascular status: stable and blood pressure returned to baseline Postop Assessment: no apparent nausea or vomiting Anesthetic complications: no   No notable events documented.   Last Vitals:  Vitals:   10/29/23 1230 10/29/23 1234  BP: (!) 120/57 (!) 126/45  Pulse: (!) 55 (!) 55  Resp: 13 15  Temp: 36.8 C   SpO2: 99% 98%    Last Pain:  Vitals:   10/29/23 1234  TempSrc:   PainSc: 0-No pain                 Lynwood KANDICE Clause

## 2023-10-29 NOTE — Transfer of Care (Signed)
 Immediate Anesthesia Transfer of Care Note  Patient: Peggy Phillips  Procedure(s) Performed: PHACOEMULSIFICATION, CATARACT, WITH IOL INSERTION 5.78 00:45.2 (Right: Eye)  Patient Location: PACU  Anesthesia Type:MAC  Level of Consciousness: awake, alert , and oriented  Airway & Oxygen Therapy: Patient Spontanous Breathing  Post-op Assessment: Report given to RN and Post -op Vital signs reviewed and stable  Post vital signs: Reviewed and stable  Last Vitals:  Vitals Value Taken Time  BP 120/57 10/29/23 12:30  Temp 36.8 C 10/29/23 12:30  Pulse 57 10/29/23 12:31  Resp 13 10/29/23 12:32  SpO2 98 % 10/29/23 12:31  Vitals shown include unfiled device data.  Last Pain:  Vitals:   10/29/23 1230  TempSrc:   PainSc: 0-No pain         Complications: No notable events documented.

## 2023-10-29 NOTE — Anesthesia Preprocedure Evaluation (Signed)
 Anesthesia Evaluation  Patient identified by MRN, date of birth, ID band Patient awake    Reviewed: Allergy & Precautions, H&P , NPO status , Patient's Chart, lab work & pertinent test results, reviewed documented beta blocker date and time   Airway Mallampati: II  TM Distance: >3 FB Neck ROM: full    Dental no notable dental hx. (+) Teeth Intact   Pulmonary neg pulmonary ROS   Pulmonary exam normal breath sounds clear to auscultation       Cardiovascular Exercise Tolerance: Good hypertension, + Valvular Problems/Murmurs  Rhythm:regular Rate:Normal     Neuro/Psych negative neurological ROS  negative psych ROS   GI/Hepatic Neg liver ROS,GERD  Medicated,,  Endo/Other  diabetesHypothyroidism    Renal/GU Renal disease     Musculoskeletal   Abdominal   Peds  Hematology  (+) Blood dyscrasia, anemia   Anesthesia Other Findings   Reproductive/Obstetrics negative OB ROS                             Anesthesia Physical Anesthesia Plan  ASA: 3  Anesthesia Plan: MAC   Post-op Pain Management:    Induction:   PONV Risk Score and Plan:   Airway Management Planned:   Additional Equipment:   Intra-op Plan:   Post-operative Plan:   Informed Consent: I have reviewed the patients History and Physical, chart, labs and discussed the procedure including the risks, benefits and alternatives for the proposed anesthesia with the patient or authorized representative who has indicated his/her understanding and acceptance.       Plan Discussed with: CRNA  Anesthesia Plan Comments:        Anesthesia Quick Evaluation

## 2023-10-30 ENCOUNTER — Encounter: Payer: Self-pay | Admitting: Ophthalmology

## 2023-11-16 NOTE — Discharge Instructions (Signed)

## 2023-11-26 ENCOUNTER — Encounter
Admission: RE | Disposition: A | Discharge status: Home / Self Care | Payer: Self-pay | Source: Home / Self Care | Attending: Ophthalmology

## 2023-11-26 ENCOUNTER — Ambulatory Visit
Admission: RE | Admit: 2023-11-26 | Discharge: 2023-11-26 | Disposition: A | Discharge status: Home / Self Care | Attending: Ophthalmology | Admitting: Ophthalmology

## 2023-11-26 ENCOUNTER — Other Ambulatory Visit: Payer: Self-pay

## 2023-11-26 ENCOUNTER — Ambulatory Visit: Payer: Self-pay | Admitting: Anesthesiology

## 2023-11-26 DIAGNOSIS — I129 Hypertensive chronic kidney disease with stage 1 through stage 4 chronic kidney disease, or unspecified chronic kidney disease: Secondary | ICD-10-CM | POA: Diagnosis not present

## 2023-11-26 DIAGNOSIS — I10 Essential (primary) hypertension: Secondary | ICD-10-CM | POA: Insufficient documentation

## 2023-11-26 DIAGNOSIS — H2512 Age-related nuclear cataract, left eye: Secondary | ICD-10-CM | POA: Diagnosis not present

## 2023-11-26 DIAGNOSIS — D631 Anemia in chronic kidney disease: Secondary | ICD-10-CM | POA: Diagnosis not present

## 2023-11-26 DIAGNOSIS — N183 Chronic kidney disease, stage 3 unspecified: Secondary | ICD-10-CM | POA: Diagnosis not present

## 2023-11-26 HISTORY — PX: CATARACT EXTRACTION W/PHACO: SHX586

## 2023-11-26 SURGERY — PHACOEMULSIFICATION, CATARACT, WITH IOL INSERTION
Anesthesia: Monitor Anesthesia Care | Site: Eye | Laterality: Left

## 2023-11-26 MED ORDER — FENTANYL CITRATE (PF) 100 MCG/2ML IJ SOLN
INTRAMUSCULAR | Status: DC | PRN
  Administered 2023-11-26 (×2): 50 ug via INTRAVENOUS

## 2023-11-26 MED ORDER — TETRACAINE HCL 0.5 % OP SOLN
OPHTHALMIC | Status: AC
  Filled 2023-11-26: qty 4

## 2023-11-26 MED ORDER — MOXIFLOXACIN HCL 0.5 % OP SOLN
OPHTHALMIC | Status: DC | PRN
  Administered 2023-11-26: .2 mL via OPHTHALMIC

## 2023-11-26 MED ORDER — FENTANYL CITRATE (PF) 100 MCG/2ML IJ SOLN
INTRAMUSCULAR | Status: AC
  Filled 2023-11-26: qty 2

## 2023-11-26 MED ORDER — ARMC OPHTHALMIC DILATING DROPS
1.0000 | OPHTHALMIC | Status: DC | PRN
  Administered 2023-11-26 (×3): 1 via OPHTHALMIC

## 2023-11-26 MED ORDER — MIDAZOLAM HCL 2 MG/2ML IJ SOLN
INTRAMUSCULAR | Status: AC
  Filled 2023-11-26: qty 2

## 2023-11-26 MED ORDER — TETRACAINE HCL 0.5 % OP SOLN
1.0000 [drp] | OPHTHALMIC | Status: DC | PRN
  Administered 2023-11-26 (×3): 1 [drp] via OPHTHALMIC

## 2023-11-26 MED ORDER — LACTATED RINGERS IV SOLN: INTRAVENOUS | Status: DC

## 2023-11-26 MED ORDER — SIGHTPATH DOSE#1 BSS IO SOLN
INTRAOCULAR | Status: DC | PRN
  Administered 2023-11-26: 15 mL via INTRAOCULAR

## 2023-11-26 MED ORDER — DEXMEDETOMIDINE HCL IN NACL 80 MCG/20ML IV SOLN
INTRAVENOUS | Status: DC | PRN
Start: 2023-11-26 — End: 2023-11-26
  Administered 2023-11-26 (×2): 8 ug via INTRAVENOUS

## 2023-11-26 MED ORDER — MIDAZOLAM HCL 2 MG/2ML IJ SOLN
INTRAMUSCULAR | Status: DC | PRN
  Administered 2023-11-26: 2 mg via INTRAVENOUS

## 2023-11-26 MED ORDER — MIDAZOLAM HCL 2 MG/2ML IJ SOLN
2.0000 mg | Freq: Once | INTRAMUSCULAR | Status: AC
  Administered 2023-11-26: 2 mg via INTRAVENOUS

## 2023-11-26 MED ORDER — EPHEDRINE 5 MG/ML INJ
5.0000 mg | Freq: Once | INTRAVENOUS | Status: AC
  Administered 2023-11-26: 5 mg via INTRAVENOUS

## 2023-11-26 MED ORDER — SIGHTPATH DOSE#1 NA HYALUR & NA CHOND-NA HYALUR IO KIT
PACK | INTRAOCULAR | Status: DC | PRN
  Administered 2023-11-26: 1 via OPHTHALMIC

## 2023-11-26 MED ORDER — SODIUM CHLORIDE 0.9% FLUSH
INTRAVENOUS | Status: DC | PRN
  Administered 2023-11-26 (×2): 10 mL via INTRAVENOUS

## 2023-11-26 MED ORDER — SIGHTPATH DOSE#1 BSS IO SOLN
INTRAOCULAR | Status: DC | PRN
  Administered 2023-11-26: 99 mL via OPHTHALMIC

## 2023-11-26 MED ORDER — EPHEDRINE 5 MG/ML INJ
INTRAVENOUS | Status: AC
  Filled 2023-11-26: qty 5

## 2023-11-26 MED ORDER — LIDOCAINE HCL (PF) 2 % IJ SOLN
INTRAOCULAR | Status: DC | PRN
  Administered 2023-11-26: 4 mL via INTRAOCULAR

## 2023-11-26 MED ORDER — ARMC OPHTHALMIC DILATING DROPS
OPHTHALMIC | Status: AC
  Filled 2023-11-26: qty 0.5

## 2023-11-26 SURGICAL SUPPLY — 10 items
CATARACT SUITE SIGHTPATH (MISCELLANEOUS) ×1 IMPLANT
DISSECTOR HYDRO NUCLEUS 50X22 (MISCELLANEOUS) ×1 IMPLANT
FEE CATARACT SUITE SIGHTPATH (MISCELLANEOUS) ×1 IMPLANT
GLOVE PI ULTRA LF STRL 7.5 (GLOVE) ×1 IMPLANT
GLOVE SURG SYN 6.5 PF PI BL (GLOVE) ×1 IMPLANT
GLOVE SURG SYN 8.5 PF PI BL (GLOVE) ×1 IMPLANT
LENS IOL CLRN VT TRC 3 22.5 IMPLANT
NDL FILTER BLUNT 18X1 1/2 (NEEDLE) ×1 IMPLANT
NEEDLE FILTER BLUNT 18X1 1/2 (NEEDLE) ×1 IMPLANT
SYR 3ML LL SCALE MARK (SYRINGE) ×1 IMPLANT

## 2023-11-26 NOTE — Transfer of Care (Signed)
 Immediate Anesthesia Transfer of Care Note  Patient: Peggy Phillips  Procedure(s) Performed: PHACOEMULSIFICATION, CATARACT, WITH IOL INSERTION 6.73 00:46.5 (Left: Eye)  Patient Location: PACU  Anesthesia Type:MAC  Level of Consciousness: awake and alert   Airway & Oxygen Therapy: Patient Spontanous Breathing  Post-op Assessment: Report given to RN and Post -op Vital signs reviewed and stable  Post vital signs: Reviewed and stable  Last Vitals:  Vitals Value Taken Time  BP 89/48 11/26/23 14:30  Temp 36.2 C 11/26/23 14:27  Pulse 62 11/26/23 14:30  Resp 11 11/26/23 14:30  SpO2 98 % 11/26/23 14:30  Vitals shown include unfiled device data.  Last Pain:  Vitals:   11/26/23 1427  TempSrc:   PainSc: 0-No pain         Complications: No notable events documented.

## 2023-11-26 NOTE — Anesthesia Preprocedure Evaluation (Addendum)
 Anesthesia Evaluation  Patient identified by MRN, date of birth, ID band Patient awake    Reviewed: Allergy & Precautions, H&P , NPO status , Patient's Chart, lab work & pertinent test results  Airway Mallampati: II  TM Distance: >3 FB Neck ROM: Full    Dental no notable dental hx.    Pulmonary neg pulmonary ROS   Pulmonary exam normal breath sounds clear to auscultation       Cardiovascular hypertension, Normal cardiovascular exam Rhythm:Regular Rate:Normal     Neuro/Psych negative neurological ROS  negative psych ROS   GI/Hepatic negative GI ROS, Neg liver ROS,,,  Endo/Other  negative endocrine ROS    Renal/GU negative Renal ROS  negative genitourinary   Musculoskeletal negative musculoskeletal ROS (+)    Abdominal   Peds negative pediatric ROS (+)  Hematology negative hematology ROS (+)   Anesthesia Other Findings Previous cataract surgery 10-29-23 Dr. Myra Patient very anxious, states she remembered everything last time and is nervous about that, states she did not hurt, just anxious Hypertension  Thyroid  disease High cholesterol  Arthritis Hypothyroidism  Kidney disease Heart murmur     Reproductive/Obstetrics negative OB ROS                              Anesthesia Physical Anesthesia Plan  ASA: 3  Anesthesia Plan: MAC   Post-op Pain Management:    Induction: Intravenous  PONV Risk Score and Plan:   Airway Management Planned: Natural Airway and Nasal Cannula  Additional Equipment:   Intra-op Plan:   Post-operative Plan:   Informed Consent: I have reviewed the patients History and Physical, chart, labs and discussed the procedure including the risks, benefits and alternatives for the proposed anesthesia with the patient or authorized representative who has indicated his/her understanding and acceptance.     Dental Advisory Given  Plan Discussed with:  Anesthesiologist, CRNA and Surgeon  Anesthesia Plan Comments: (Patient consented for risks of anesthesia including but not limited to:  - adverse reactions to medications - damage to eyes, teeth, lips or other oral mucosa - nerve damage due to positioning  - sore throat or hoarseness - Damage to heart, brain, nerves, lungs, other parts of body or loss of life  Patient voiced understanding and assent.)         Anesthesia Quick Evaluation

## 2023-11-26 NOTE — Anesthesia Postprocedure Evaluation (Signed)
 Anesthesia Post Note  Patient: Peggy Phillips  Procedure(s) Performed: PHACOEMULSIFICATION, CATARACT, WITH IOL INSERTION 6.73 00:46.5 (Left: Eye)  Anesthesia Type: MAC Anesthetic complications: no Comments: Patient is much happier with her anesthetic experience this surgery and does  not recall intra-op events.     No notable events documented.   Last Vitals:  Vitals:   11/26/23 1440 11/26/23 1442  BP: (!) 103/50 (!) 104/46  Pulse:  68  Resp:  10  Temp:    SpO2:  96%    Last Pain:  Vitals:   11/26/23 1427  TempSrc:   PainSc: 0-No pain                 Ethyn Schetter C Xue Low

## 2023-11-26 NOTE — Op Note (Signed)
 OPERATIVE NOTE  Peggy Phillips [id number] 11/26/2023   PREOPERATIVE DIAGNOSIS:  Nuclear sclerotic cataract left eye.  H25.12   POSTOPERATIVE DIAGNOSIS:    Nuclear sclerotic cataract left eye.     PROCEDURE:  Phacoemusification with posterior chamber intraocular lens placement of the left eye   LENS:   Implant Name Type Inv. Item Serial No. Manufacturer Lot No. LRB No. Used Action  LENS IOL CLRN VT TRC 3 22.5 - [id number]  LENS IOL CLRN VT TRC 3 22.5 [id number] [employer]  Left 1 Implanted      Procedure(s): PHACOEMULSIFICATION, CATARACT, WITH IOL INSERTION 6.73 00:46.5 (Left)  SURGEON:  Adine Novak, MD, MPH   ANESTHESIA:  Topical with tetracaine  drops augmented with 1% preservative-free intracameral lidocaine .  ESTIMATED BLOOD LOSS: <1 mL   COMPLICATIONS:  None.   DESCRIPTION OF PROCEDURE:  The patient was identified in the holding room and transported to the operating room and placed in the supine position under the operating microscope.  The left eye was identified as the operative eye and it was prepped and draped in the usual sterile ophthalmic fashion.  The verion system was registered without difficulty.   A 1.0 millimeter clear-corneal paracentesis was made at the 5:00 position. 0.5 ml of preservative-free 1% lidocaine  with epinephrine  was injected into the anterior chamber.  The anterior chamber was filled with viscoelastic.  A 2.4 millimeter keratome was used to make a near-clear corneal incision at the 2:00 position.  A curvilinear capsulorrhexis was made with a cystotome and capsulorrhexis forceps.  Balanced salt solution was used to hydrodissect and hydrodelineate the nucleus.   Phacoemulsification was then used in stop and chop fashion to remove the lens nucleus and epinucleus.  The remaining cortex was then removed using the irrigation and aspiration handpiece. Viscoelastic was then placed into the capsular bag to distend it for lens placement.  A lens was then  injected into the capsular bag.  The remaining viscoelastic was aspirated.  The lens was rotated with guidance from the verion system.   Wounds were hydrated with balanced salt solution.  The anterior chamber was inflated to a physiologic pressure with balanced salt solution.  Intracameral vigamox  0.1 mL undiltued was injected into the eye and a drop placed onto the ocular surface.  No wound leaks were noted.  The patient was taken to the recovery room in stable condition without complications of anesthesia or surgery  Adine Novak 11/26/2023, 2:25 PM

## 2023-11-26 NOTE — H&P (Signed)
 [facility]   Primary Care Physician:  Steva Clotilda DEL, NP Ophthalmologist: Dr. Adine Novak  Pre-Procedure History & Physical: HPI:  Peggy Phillips is a 83 y.o. female here for cataract surgery.   Past Medical History:  Diagnosis Date   Arthritis    Heart murmur    High cholesterol    Hypertension    Hypothyroidism    Kidney disease    Thyroid  disease     Past Surgical History:  Procedure Laterality Date   ABDOMINAL HYSTERECTOMY     APPENDECTOMY     BREAST BIOPSY Left 07/27/2004   neg   BREAST SURGERY     CATARACT EXTRACTION W/PHACO Right 10/29/2023   Procedure: PHACOEMULSIFICATION, CATARACT, WITH IOL INSERTION 5.78 00:45.2;  Surgeon: Novak Adine Anes, MD;  Location: [facility];  Service: Ophthalmology;  Laterality: Right;   GALLBLADDER SURGERY     herniated disc      Prior to Admission medications   Medication Sig Start Date End Date Taking? Authorizing Provider  amLODipine (NORVASC) 5 MG tablet Take 1 tablet by mouth daily. 06/07/23 06/06/24 Yes [provider]  ammonium lactate (LAC-HYDRIN) 12 % lotion Apply 1 application topically as needed.  05/02/17  Yes [provider]  Cyanocobalamin  (VITAMIN B 12) 500 MCG TABS Take 500 mcg by mouth daily.    Yes [provider]  diclofenac sodium (VOLTAREN) 1 % GEL Apply 2 g topically as needed.  12/20/18  Yes [provider]  Docusate Sodium 100 MG capsule Take 200 mg by mouth daily.    Yes [provider]  empagliflozin (JARDIANCE) 25 MG TABS tablet Take 25 mg by mouth daily. For Kidneys   Yes [provider]  ezetimibe (ZETIA) 10 MG tablet Take by mouth. 07/18/19  Yes [provider]  FLUoxetine (PROZAC) 40 MG capsule Take 40 mg by mouth daily.   Yes [provider]  fluticasone (FLONASE) 50 MCG/ACT nasal spray Place 2 sprays into both nostrils daily.  12/21/13  Yes [provider]  HYDROmorphone (DILAUDID) 4 MG tablet Take 4 mg by  mouth 4 (four) times daily. 02/23/14  Yes [provider]  levothyroxine (SYNTHROID) 112 MCG tablet Take by mouth. 07/18/19  Yes [provider]  lisinopril (ZESTRIL) 40 MG tablet Take 40 mg by mouth daily.  12/18/16  Yes [provider]  naloxone TAMMY) 4 MG/0.1ML LIQD nasal spray kit  07/22/18  Yes [provider]  ondansetron  (ZOFRAN ) 4 MG tablet Take by mouth every 8 (eight) hours as needed. 10/10/19  Yes [provider]  polycarbophil (FIBERCON) 625 MG tablet Take 625 mg by mouth daily.    Yes [provider]  Vitamin D, Ergocalciferol, (DRISDOL) 50000 UNITS CAPS capsule Take 50,000 Units by mouth every 7 (seven) days.  01/19/14  Yes [provider]  Wheat Dextrin (BENEFIBER PO) Take by mouth daily.   Yes [provider]  ZETIA 10 MG tablet Take 10 mg by mouth daily. 02/16/14  Yes [provider]    Allergies as of 08/22/2023 - Review Complete 07/10/2023  Allergen Reaction Noted   Itraconazole Hives 12/26/2010   Hydrocodone Itching 09/17/2019   Naloxegol Diarrhea and Nausea And Vomiting 10/16/2019   Oxycodone  Itching 09/17/2019   Atorvastatin Other (See Comments) 03/04/2014   Celecoxib  06/07/2023   Codeine sulfate Other (See Comments) 03/04/2014   Hydralazine Itching 09/12/2019   Meloxicam Diarrhea 03/04/2014   Other Hives 03/04/2014   Simvastatin  03/04/2014   Sulfa  antibiotics Other (See Comments) 03/04/2014   Sulfasalazine Other (See Comments) 03/04/2014   Trazodone Other (See Comments) 08/03/2022   Doxepin  01/27/2018    Family History  Problem Relation Age of Onset   Heart disease Mother    Heart disease Other    Breast cancer Neg Hx     Social History   Socioeconomic History   Marital status: Widowed    Spouse name: Not on file   Number of children: Not on file   Years of education: Not on file   Highest education level: Not on file  Occupational History   Not on file  Tobacco Use    Smoking status: Never   Smokeless tobacco: Never  Vaping Use   Vaping status: Never Used  Substance and Sexual Activity   Alcohol  use: Not on file   Drug use: Never   Sexual activity: Not Currently  Other Topics Concern   Not on file  Social History Narrative   Not on file   Social Drivers of Health   Financial Resource Strain: Low Risk  (07/16/2023)   Received from [facility]   Overall Financial Resource Strain (CARDIA)    Difficulty of Paying Living Expenses: Not hard at all  Food Insecurity: No Food Insecurity (07/16/2023)   Received from [facility]   Hunger Vital Sign    Within the past 12 months, you worried that your food would run out before you got the money to buy more.: Never true    Within the past 12 months, the food you bought just didn't last and you didn't have money to get more.: Never true  Transportation Needs: No Transportation Needs (07/16/2023)   Received from [facility] - Transportation    In the past 12 months, has lack of transportation kept you from medical appointments or from getting medications?: No    Lack of Transportation (Non-Medical): No  Physical Activity: Inactive (06/08/2022)   Exercise Vital Sign    Days of Exercise per Week: 0 days    Minutes of Exercise per Session: 0 min  Stress: No Stress Concern Present (06/08/2022)   [employer] of Occupational [employer] - Occupational Stress Questionnaire    Feeling of Stress : Not at all  Social Connections: Moderately Integrated (06/08/2022)   Social Connection and Isolation Panel    Frequency of Communication with Friends and Family: More than three times a week    Frequency of Social Gatherings with Friends and Family: Once a week    Attends Religious Services: More than 4 times per year    Active Member of [employer] or Organizations: No    Attends Engineer, structural: More than 4 times per year    Marital Status: Widowed   Catering manager Violence: Not on file    Review of Systems: See HPI, otherwise negative ROS  Physical Exam: BP (!) 156/54   Pulse (!) 56   Temp 98 F (36.7 C) (Temporal)   Resp 14   Wt 83 kg   SpO2 97%   BMI 31.40 kg/m  General:   Alert, cooperative. Head:  Normocephalic and atraumatic. Respiratory:  Normal work of breathing. Cardiovascular:  NAD  Impression/Plan: Peggy Phillips is here for cataract surgery.  Risks, benefits, limitations, and alternatives regarding cataract surgery have been reviewed with the patient.  Questions have been answered.  All parties agreeable.   Adine Novak, MD  11/26/2023, 1:58 PM

## 2023-11-27 ENCOUNTER — Encounter: Payer: Self-pay | Admitting: Ophthalmology

## 2024-01-02 DIAGNOSIS — G894 Chronic pain syndrome: Secondary | ICD-10-CM | POA: Diagnosis not present

## 2024-01-02 DIAGNOSIS — M542 Cervicalgia: Secondary | ICD-10-CM | POA: Diagnosis not present

## 2024-01-02 DIAGNOSIS — M792 Neuralgia and neuritis, unspecified: Secondary | ICD-10-CM | POA: Diagnosis not present

## 2024-01-02 DIAGNOSIS — M47896 Other spondylosis, lumbar region: Secondary | ICD-10-CM | POA: Diagnosis not present

## 2024-01-02 DIAGNOSIS — M5412 Radiculopathy, cervical region: Secondary | ICD-10-CM | POA: Diagnosis not present

## 2024-01-02 DIAGNOSIS — M5416 Radiculopathy, lumbar region: Secondary | ICD-10-CM | POA: Diagnosis not present

## 2024-01-02 DIAGNOSIS — M25551 Pain in right hip: Secondary | ICD-10-CM | POA: Diagnosis not present

## 2024-01-02 DIAGNOSIS — Z79899 Other long term (current) drug therapy: Secondary | ICD-10-CM | POA: Diagnosis not present

## 2024-01-14 DIAGNOSIS — I5032 Chronic diastolic (congestive) heart failure: Secondary | ICD-10-CM | POA: Diagnosis not present

## 2024-01-14 DIAGNOSIS — I10 Essential (primary) hypertension: Secondary | ICD-10-CM | POA: Diagnosis not present

## 2024-01-14 DIAGNOSIS — F419 Anxiety disorder, unspecified: Secondary | ICD-10-CM | POA: Diagnosis not present

## 2024-01-14 DIAGNOSIS — E785 Hyperlipidemia, unspecified: Secondary | ICD-10-CM | POA: Diagnosis not present

## 2024-01-22 DIAGNOSIS — N183 Chronic kidney disease, stage 3 unspecified: Secondary | ICD-10-CM | POA: Diagnosis not present

## 2024-01-22 DIAGNOSIS — E1122 Type 2 diabetes mellitus with diabetic chronic kidney disease: Secondary | ICD-10-CM | POA: Diagnosis not present

## 2024-01-22 DIAGNOSIS — Z23 Encounter for immunization: Secondary | ICD-10-CM | POA: Diagnosis not present

## 2024-01-22 DIAGNOSIS — F419 Anxiety disorder, unspecified: Secondary | ICD-10-CM | POA: Diagnosis not present

## 2024-01-22 DIAGNOSIS — I5030 Unspecified diastolic (congestive) heart failure: Secondary | ICD-10-CM | POA: Diagnosis not present

## 2024-01-22 DIAGNOSIS — F3340 Major depressive disorder, recurrent, in remission, unspecified: Secondary | ICD-10-CM | POA: Diagnosis not present

## 2024-01-22 DIAGNOSIS — I152 Hypertension secondary to endocrine disorders: Secondary | ICD-10-CM | POA: Diagnosis not present

## 2024-01-22 DIAGNOSIS — E1159 Type 2 diabetes mellitus with other circulatory complications: Secondary | ICD-10-CM | POA: Diagnosis not present

## 2024-01-22 DIAGNOSIS — N951 Menopausal and female climacteric states: Secondary | ICD-10-CM | POA: Diagnosis not present

## 2024-01-22 DIAGNOSIS — Z1331 Encounter for screening for depression: Secondary | ICD-10-CM | POA: Diagnosis not present

## 2024-01-22 DIAGNOSIS — E1169 Type 2 diabetes mellitus with other specified complication: Secondary | ICD-10-CM | POA: Diagnosis not present

## 2024-01-22 DIAGNOSIS — R3989 Other symptoms and signs involving the genitourinary system: Secondary | ICD-10-CM | POA: Diagnosis not present

## 2024-01-22 DIAGNOSIS — Z09 Encounter for follow-up examination after completed treatment for conditions other than malignant neoplasm: Secondary | ICD-10-CM | POA: Diagnosis not present

## 2024-03-27 DIAGNOSIS — M25551 Pain in right hip: Secondary | ICD-10-CM | POA: Diagnosis not present

## 2024-03-27 DIAGNOSIS — M47896 Other spondylosis, lumbar region: Secondary | ICD-10-CM | POA: Diagnosis not present

## 2024-03-27 DIAGNOSIS — M542 Cervicalgia: Secondary | ICD-10-CM | POA: Diagnosis not present

## 2024-03-27 DIAGNOSIS — Z79899 Other long term (current) drug therapy: Secondary | ICD-10-CM | POA: Diagnosis not present

## 2024-03-27 DIAGNOSIS — M5412 Radiculopathy, cervical region: Secondary | ICD-10-CM | POA: Diagnosis not present

## 2024-03-27 DIAGNOSIS — M792 Neuralgia and neuritis, unspecified: Secondary | ICD-10-CM | POA: Diagnosis not present

## 2024-03-27 DIAGNOSIS — G894 Chronic pain syndrome: Secondary | ICD-10-CM | POA: Diagnosis not present
